# Patient Record
Sex: Female | Born: 1955
Health system: Southern US, Community
[De-identification: ages and names within clinical notes are randomized; demographics above are authoritative.]

## PROBLEM LIST (undated history)

## (undated) DIAGNOSIS — K219 Gastro-esophageal reflux disease without esophagitis: Secondary | ICD-10-CM

## (undated) DIAGNOSIS — I10 Essential (primary) hypertension: Secondary | ICD-10-CM

## (undated) DIAGNOSIS — I255 Ischemic cardiomyopathy: Secondary | ICD-10-CM

## (undated) DIAGNOSIS — I2510 Atherosclerotic heart disease of native coronary artery without angina pectoris: Secondary | ICD-10-CM

## (undated) DIAGNOSIS — E785 Hyperlipidemia, unspecified: Secondary | ICD-10-CM

## (undated) DIAGNOSIS — Z8679 Personal history of other diseases of the circulatory system: Secondary | ICD-10-CM

## (undated) DIAGNOSIS — I5022 Chronic systolic (congestive) heart failure: Secondary | ICD-10-CM

## (undated) HISTORY — PX: APPENDECTOMY: SHX54

## (undated) HISTORY — PX: OTHER SURGICAL HISTORY: SHX169

## (undated) HISTORY — DX: Hyperlipidemia, unspecified: E78.5

## (undated) HISTORY — DX: Personal history of other diseases of the circulatory system: Z86.79

## (undated) HISTORY — DX: Essential (primary) hypertension: I10

---
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
## 1999-06-06 ENCOUNTER — Ambulatory Visit (HOSPITAL_COMMUNITY): Admission: RE | Admit: 1999-06-06 | Discharge: 1999-06-06 | Payer: Self-pay | Admitting: Cardiology

## 2001-05-05 ENCOUNTER — Encounter: Payer: Self-pay | Admitting: Family Medicine

## 2001-05-05 ENCOUNTER — Ambulatory Visit (HOSPITAL_COMMUNITY): Admission: RE | Admit: 2001-05-05 | Discharge: 2001-05-05 | Payer: Self-pay | Admitting: *Deleted

## 2002-06-22 ENCOUNTER — Encounter (HOSPITAL_COMMUNITY): Admission: RE | Admit: 2002-06-22 | Discharge: 2002-07-22 | Payer: Self-pay | Admitting: Cardiology

## 2002-06-22 ENCOUNTER — Encounter: Payer: Self-pay | Admitting: Cardiology

## 2002-07-17 ENCOUNTER — Ambulatory Visit (HOSPITAL_COMMUNITY): Admission: RE | Admit: 2002-07-17 | Discharge: 2002-07-17 | Payer: Self-pay | Admitting: Pulmonary Disease

## 2002-08-01 ENCOUNTER — Ambulatory Visit (HOSPITAL_COMMUNITY): Admission: RE | Admit: 2002-08-01 | Discharge: 2002-08-01 | Payer: Self-pay | Admitting: Pulmonary Disease

## 2002-08-08 ENCOUNTER — Encounter (HOSPITAL_COMMUNITY): Admission: RE | Admit: 2002-08-08 | Discharge: 2002-09-07 | Payer: Self-pay | Admitting: Pulmonary Disease

## 2002-09-08 ENCOUNTER — Encounter (HOSPITAL_COMMUNITY): Admission: RE | Admit: 2002-09-08 | Discharge: 2002-10-08 | Payer: Self-pay | Admitting: Pulmonary Disease

## 2004-07-03 ENCOUNTER — Ambulatory Visit (HOSPITAL_COMMUNITY): Admission: RE | Admit: 2004-07-03 | Discharge: 2004-07-03 | Payer: Self-pay | Admitting: Family Medicine

## 2004-07-08 ENCOUNTER — Ambulatory Visit (HOSPITAL_COMMUNITY): Admission: RE | Admit: 2004-07-08 | Discharge: 2004-07-08 | Payer: Self-pay | Admitting: Family Medicine

## 2004-07-17 ENCOUNTER — Ambulatory Visit: Payer: Self-pay | Admitting: *Deleted

## 2004-07-24 ENCOUNTER — Encounter: Payer: Self-pay | Admitting: Emergency Medicine

## 2004-07-24 ENCOUNTER — Ambulatory Visit: Payer: Self-pay | Admitting: Cardiology

## 2004-07-24 ENCOUNTER — Observation Stay (HOSPITAL_COMMUNITY): Admission: AD | Admit: 2004-07-24 | Discharge: 2004-07-26 | Payer: Self-pay | Admitting: Cardiology

## 2004-07-29 ENCOUNTER — Ambulatory Visit: Payer: Self-pay | Admitting: *Deleted

## 2004-08-04 ENCOUNTER — Ambulatory Visit (HOSPITAL_COMMUNITY): Admission: RE | Admit: 2004-08-04 | Discharge: 2004-08-04 | Payer: Self-pay | Admitting: Family Medicine

## 2005-06-22 ENCOUNTER — Emergency Department (HOSPITAL_COMMUNITY): Admission: EM | Admit: 2005-06-22 | Discharge: 2005-06-22 | Payer: Self-pay | Admitting: Emergency Medicine

## 2005-10-26 ENCOUNTER — Ambulatory Visit (HOSPITAL_COMMUNITY): Admission: RE | Admit: 2005-10-26 | Discharge: 2005-10-26 | Payer: Self-pay | Admitting: Family Medicine

## 2007-03-28 ENCOUNTER — Ambulatory Visit (HOSPITAL_COMMUNITY): Admission: RE | Admit: 2007-03-28 | Discharge: 2007-03-28 | Payer: Self-pay | Admitting: Family Medicine

## 2008-06-14 ENCOUNTER — Ambulatory Visit (HOSPITAL_COMMUNITY): Admission: RE | Admit: 2008-06-14 | Discharge: 2008-06-14 | Payer: Self-pay | Admitting: Family Medicine

## 2010-09-05 NOTE — Cardiovascular Report (Signed)
NAMEMETZLI, POLLICK             ACCOUNT NO.:  [id number]   MEDICAL RECORD NO.:  [id number]          PATIENT TYPE:  INP   LOCATION:  6531                         FACILITY:  [facility]   PHYSICIAN:  Arturo Morton. Riley Kill, M.D. [facility] OF BIRTH:  1955-11-14   DATE OF PROCEDURE:  07/25/2004  DATE OF DISCHARGE:                              CARDIAC CATHETERIZATION   INDICATIONS:  Ms. Waskey is a 55 year old female.  She has had previous  catheterization with about 10-20% proximal LAD stenosis.  She has had some  recurrent chest pain and she has been difficult to evaluate on a noninvasive  basis.  She was transferred for further evaluation.   Of note, with her last episode of chest pain we did an aortic root shot to  exclude potential causes in the aortic root.   PROCEDURES:  1.  Left heart catheterization.  2.  Selective coronary arteriography.  3.  Selective left ventriculography.   DESCRIPTION OF PROCEDURE:  The patient was brought to the catheterization  lab and prepped and draped in the usual fashion.  Through an anterior  puncture, the  right femoral artery was easily entered.  We initially placed  a 4-French sheath.  There were separate take offs to the LAD and circumflex,  and we were unable to engage the LAD with a 4-0 or 3.5 diagnostic 4-French  catheter.  We eventually had to upgrade to a 6-French to use a JL-3 guiding  catheter.  The LAD was successfully engaged with this.  Ventriculography was  performed in the RAO projection.  She tolerated the procedure without  complication and was taken to the holding area in satisfactory clinical  condition.   HEMODYNAMIC DATA:  1.  Central aortic pressure:  160/95.  2.  Following this pressure, the patient was given intravenous metoprolol 5      mg.  3.  Left ventricular pressure:  134/11.  4.  No gradient on pullback across the aortic valve.   ANGIOGRAPHIC DATA:  1.  The left main is short and free of critical disease.  2.  The left  anterior descending artery courses to the apex.  Near the first      septal perforator and small first diagonal is an eccentric plaque with      calcification.  There is less than 20% narrowing at this site.  As on      the previous study, there is clear-cut eccentric calcification of the      vessel.  The distal vessel consists of a very large caliber 3.5 mm LAD      that courses to the apical tip, wraps the apex and provides the distal      inferior segment.  3.  The circumflex is a relatively large vessel.  The circumflex provides a      first marginal branch that has very minimal luminal irregularity in the      mid portion.  The vessel then courses around to the posterior lateral      segment where it provides a tiny posterior lateral branch and two      somewhat  larger posterior lateral branches.  In none of these is there      evidence of high grade focal narrowing.  4.  The right coronary artery is a fairly non or co-dominant vessel      providing a single small caliber posterior descending branch.  There is      perhaps minimal irregularity in the proximal PDA, but this is such a      tiny branch that it is insignificant.   Ventriculography in the RAO projection reveals preserved global systolic  function. No segmental abnormalities or contracture are identified.  Aortic  leaflets move well.  No significant mitral regurgitation is seen.   CONCLUSIONS:  1.  Well preserved left ventricular function.  2.  Calcification and mild narrowing of the proximal left anterior      descending artery as on the previous study without significant      progression in a large caliber vessel.   DISPOSITION:  1.  Discontinuation of heparin.  2.  Check D-dimer.      TDS/MEDQ  D:  07/25/2004  T:  07/25/2004  Job:  [id number]   cc:   Patrica Duel, M.D.  [address]  [place]  [place] [address]  Fax: [phone]   Vida Roller, M.D.  Fax: [phone]

## 2010-09-05 NOTE — Cardiovascular Report (Signed)
. [facility]  Patient:    Gloria Thompson, Gloria Thompson                    MRN: [id number] Proc. Date: 06/06/99 Adm. Date:  [id number] Disc. Date: [id number] Attending:  Ronaldo Miyamoto CC:         Gilford Silvius, M.D.             Thomas C. Wall, M.D. [facility]             CV Laboratory                        Cardiac Catheterization  INDICATIONS:  Ms. Jaskulski is a very pleasant 55 year old widowed mother of three, who presents with modest chest discomfort.  The exact etiology of the discomfort is  uncertain.  She is not a smoker but does have a strong family history of coronary heart disease.  As a result she was seen in consultation by Dr. Daleen Squibb and referred for further evaluation.  PROCEDURES: 1. Left heart catheterization. 2. Selective coronary angiography. 3. Selective left ventriculography. 4. Aortic root aortography.  DESCRIPTION OF PROCEDURE:  The procedure was performed from the right femoral artery using 6 French catheters.  She tolerated the procedure without complication.  HEMODYNAMICS:  The central aortic pressure was 171/105. LV pressure 172/17.  No  gradient on pullback across the aortic valve.  ADDENDUM:  The patient was given intravenous labetalol as well as Lopressor after the procedure to try to bring her blood pressure down.  ANGIOGRAPHIC DATA:  The proximal aortic root demonstrated no evidence of dissection and there was no significant aortic regurgitation.  The aortic leaflets moved well and there was no evidence of significant regurgitation across the valve.  The ventriculogram done in the RAO projection reveals preserved global systolic  function.  Ejection fraction would be estimated in the range of 70%.  The right coronary artery is a small caliber vessel that provides a single posterior single vessel.  The right coronary is tortuous distally.  It is free f significant disease.  The left main coronary artery is extremely  short.  As a result there was selective views of both the left anterior descending and circumflex coronary arteries.  The left anterior descending artery coursed through the apex and wrapped the apical tip.  The LAD itself provided a septal perforator and two diagonals.  Between the origin of the septal and the two diagonals was a mild area of 10% plaquing. There was some calcification noted in this area as well.  However, there does not appear to be significant flow limitation and the artery is a large caliber vessel at this point, being perhaps 3.5 mm or greater.  The mid and distal LAD wrap the apex.  Thre were three diagonal branches.  These were free of significant disease. The distal inferior wall is also supplied by the left anterior descending system.  The AV circumflex provides a marginal branch in the distal posterolateral system, which is nearly codominant with the right coronary system.  This system appears to be free of significant disease.  CONCLUSIONS: 1. Well-preserved left ventricular function. 2. Calcification and very mild eccentric plaquing of the mid left anterior    descending artery.  DISPOSITION:  The patient will be treated medically.  Followup will be with Dr. Daleen Squibb. DD:  06/06/99 TD:  06/07/99 Job: [id number]

## 2010-09-05 NOTE — Discharge Summary (Signed)
NAMESHIR, BERGMAN             ACCOUNT NO.:  [id number]   MEDICAL RECORD NO.:  [id number]          PATIENT TYPE:  INP   LOCATION:  6531                         FACILITY:  [facility]   PHYSICIAN:  Arturo Morton. Riley Kill, M.D. [facility] OF BIRTH:  03/01/1956   DATE OF ADMISSION:  07/24/2004  DATE OF DISCHARGE:  07/26/2004                                 DISCHARGE SUMMARY   PROCEDURES:  1.  Cardiac catheterization.  2.  Coronary arteriogram.  3.  Left ventriculogram.   DISCHARGE DIAGNOSES:  1.  Chest pain, no significant coronary artery disease by catheterization,      calcified left anterior descending less than 20% stenosis and normal      ejection fraction with a negative d-dimer as well.  2.  Hypertension.  3.  Mild hyperlipidemia with a total cholesterol of 185, triglycerides 74,      HDL 57, LDL 113, diet therapy at this time.  4.  Status post C-section.  5.  Status post appendectomy.   ALLERGIES:  Allergy or intolerance to CODEINE and MOST ANTIBIOTICS.   HOSPITAL COURSE:  Ms. Jaggers is a 55 year old female with a history of  nonobstructive coronary artery disease by cath in 2004.  She had exertional  chest pain which woke her at 7 a.m. on the day of admission.  She was  admitted for further evaluation and treatment.   Her cardiac enzymes were negative for MI and cardiac catheterization was  indicated to evaluate her for symptoms concerning for unstable anginal pain  and minor ST changes on her EKG.   The cardiac catheterization showed less than 20% stenosis of the proximal  LAD which was calcified vessel and no other disease.  Her EF was normal.  A  d-dimer was checked as well and was within normal limits at 0.37.   The next day Ms. Bebeau's groin was without ecchymosis, bruit, or hematoma.  Her vitals were stable.  She had no more chest pain and no shortness of  breath.  She did have some nausea but stated she had this at times.  Will  add a proton pump inhibitor to medication  regimen and decrease her aspirin  from 325 mg to 81 mg a day.  Pending evaluation by M.D., Ms. Solana is  tentatively considered stable for discharge on July 26, 2004 and will follow  up as an outpatient with Dr. Foster Simpson and Dr. Dorethea Clan.   DISCHARGE INSTRUCTIONS:   ACTIVITY:  Her activity level is to include no driving or strenuous activity  for two days.   MEDICATIONS:  She is to use Tylenol for pain.   DIET:  She is to stick to a low fat diet.   FOLLOW UP:  She is to call our office for problems with the cath site.  She  is to follow up with Dr. Dorethea Clan and Dr. Foster Simpson.   DISCHARGE MEDICATIONS:  1.  Norvasc 5 mg daily.  2.  Coated aspirin 81 mg daily.  3.  Protonix 40 mg daily.      RB/MEDQ  D:  07/26/2004  T:  07/26/2004  Job:  [id number]  cc:   Patrica Duel, M.D.  [address]  [place]  [place] [address]  Fax: [phone]   Vida Roller, M.D.  Fax: [phone]

## 2011-03-02 ENCOUNTER — Emergency Department (HOSPITAL_COMMUNITY)
Admission: EM | Admit: 2011-03-02 | Discharge: 2011-03-02 | Disposition: A | Discharge status: Home / Self Care | Payer: No Typology Code available for payment source | Attending: Emergency Medicine | Admitting: Emergency Medicine

## 2011-03-02 ENCOUNTER — Encounter: Payer: Self-pay | Admitting: Emergency Medicine

## 2011-03-02 DIAGNOSIS — Y92410 Unspecified street and highway as the place of occurrence of the external cause: Secondary | ICD-10-CM | POA: Insufficient documentation

## 2011-03-02 DIAGNOSIS — I10 Essential (primary) hypertension: Secondary | ICD-10-CM | POA: Insufficient documentation

## 2011-03-02 DIAGNOSIS — K219 Gastro-esophageal reflux disease without esophagitis: Secondary | ICD-10-CM | POA: Insufficient documentation

## 2011-03-02 DIAGNOSIS — R209 Unspecified disturbances of skin sensation: Secondary | ICD-10-CM | POA: Insufficient documentation

## 2011-03-02 DIAGNOSIS — IMO0001 Reserved for inherently not codable concepts without codable children: Secondary | ICD-10-CM | POA: Insufficient documentation

## 2011-03-02 DIAGNOSIS — M7918 Myalgia, other site: Secondary | ICD-10-CM

## 2011-03-02 DIAGNOSIS — M542 Cervicalgia: Secondary | ICD-10-CM | POA: Insufficient documentation

## 2011-03-02 DIAGNOSIS — IMO0002 Reserved for concepts with insufficient information to code with codable children: Secondary | ICD-10-CM | POA: Insufficient documentation

## 2011-03-02 HISTORY — DX: Essential (primary) hypertension: I10

## 2011-03-02 HISTORY — DX: Gastro-esophageal reflux disease without esophagitis: K21.9

## 2011-03-02 MED ORDER — CYCLOBENZAPRINE HCL 5 MG PO TABS: 5.0000 mg | ORAL_TABLET | Freq: Three times a day (TID) | ORAL | Status: AC | PRN

## 2011-03-02 MED ORDER — OXYCODONE-ACETAMINOPHEN 5-325 MG PO TABS: 1.0000 | ORAL_TABLET | ORAL | Status: AC | PRN

## 2011-03-02 NOTE — ED Provider Notes (Signed)
Medical screening examination/treatment/procedure(s) were performed by non-physician practitioner and as supervising physician I was immediately available for consultation/collaboration.   Benny Lennert, MD 03/02/11 [phone]
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
## 2011-03-02 NOTE — ED Notes (Signed)
Pt a/ox4. Resp even and unlabored. NAD at this time. D/C instructions and Rx x 2 reviewed with pt. Pt verbalized understanding. Pt ambulated to POV with steady gate.  

## 2011-03-17 ENCOUNTER — Other Ambulatory Visit (HOSPITAL_COMMUNITY): Payer: Self-pay | Admitting: Family Medicine

## 2011-03-19 ENCOUNTER — Other Ambulatory Visit (HOSPITAL_COMMUNITY): Payer: Self-pay | Admitting: Family Medicine

## 2011-03-19 ENCOUNTER — Ambulatory Visit (HOSPITAL_COMMUNITY)
Admission: RE | Admit: 2011-03-19 | Discharge: 2011-03-19 | Disposition: A | Discharge status: Home / Self Care | Payer: No Typology Code available for payment source | Source: Ambulatory Visit | Attending: Family Medicine | Admitting: Family Medicine

## 2011-03-19 DIAGNOSIS — M542 Cervicalgia: Secondary | ICD-10-CM | POA: Insufficient documentation

## 2011-03-19 DIAGNOSIS — M546 Pain in thoracic spine: Secondary | ICD-10-CM | POA: Insufficient documentation

## 2011-03-19 DIAGNOSIS — M545 Low back pain, unspecified: Secondary | ICD-10-CM | POA: Insufficient documentation

## 2011-03-19 DIAGNOSIS — X58XXXA Exposure to other specified factors, initial encounter: Secondary | ICD-10-CM | POA: Insufficient documentation

## 2011-03-19 DIAGNOSIS — S060X0A Concussion without loss of consciousness, initial encounter: Secondary | ICD-10-CM | POA: Insufficient documentation

## 2011-03-19 DIAGNOSIS — R51 Headache: Secondary | ICD-10-CM | POA: Insufficient documentation

## 2012-12-27 ENCOUNTER — Other Ambulatory Visit (HOSPITAL_COMMUNITY): Payer: Self-pay | Admitting: *Deleted

## 2012-12-27 DIAGNOSIS — Z139 Encounter for screening, unspecified: Secondary | ICD-10-CM

## 2013-01-02 ENCOUNTER — Ambulatory Visit (HOSPITAL_COMMUNITY)
Admission: RE | Admit: 2013-01-02 | Discharge: 2013-01-02 | Disposition: A | Discharge status: Home / Self Care | Payer: Self-pay | Source: Ambulatory Visit | Attending: *Deleted | Admitting: *Deleted

## 2013-01-02 DIAGNOSIS — Z139 Encounter for screening, unspecified: Secondary | ICD-10-CM

## 2013-09-20 ENCOUNTER — Encounter (HOSPITAL_COMMUNITY): Payer: Self-pay | Admitting: Emergency Medicine

## 2013-09-20 ENCOUNTER — Emergency Department (HOSPITAL_COMMUNITY)
Admission: EM | Admit: 2013-09-20 | Discharge: 2013-09-20 | Disposition: A | Discharge status: Home / Self Care | Payer: BC Managed Care – PPO | Attending: Emergency Medicine | Admitting: Emergency Medicine

## 2013-09-20 DIAGNOSIS — K219 Gastro-esophageal reflux disease without esophagitis: Secondary | ICD-10-CM | POA: Insufficient documentation

## 2013-09-20 DIAGNOSIS — R1110 Vomiting, unspecified: Secondary | ICD-10-CM | POA: Insufficient documentation

## 2013-09-20 DIAGNOSIS — I10 Essential (primary) hypertension: Secondary | ICD-10-CM | POA: Insufficient documentation

## 2013-09-20 DIAGNOSIS — R05 Cough: Secondary | ICD-10-CM | POA: Insufficient documentation

## 2013-09-20 DIAGNOSIS — R509 Fever, unspecified: Secondary | ICD-10-CM | POA: Insufficient documentation

## 2013-09-20 DIAGNOSIS — Z79899 Other long term (current) drug therapy: Secondary | ICD-10-CM | POA: Insufficient documentation

## 2013-09-20 DIAGNOSIS — H109 Unspecified conjunctivitis: Secondary | ICD-10-CM | POA: Insufficient documentation

## 2013-09-20 DIAGNOSIS — Z87891 Personal history of nicotine dependence: Secondary | ICD-10-CM | POA: Insufficient documentation

## 2013-09-20 DIAGNOSIS — R059 Cough, unspecified: Secondary | ICD-10-CM | POA: Insufficient documentation

## 2013-09-20 MED ORDER — PREDNISONE 10 MG PO TABS: 20.0000 mg | ORAL_TABLET | Freq: Two times a day (BID) | ORAL | Status: DC

## 2013-09-20 MED ORDER — PREDNISONE 50 MG PO TABS
60.0000 mg | ORAL_TABLET | Freq: Once | ORAL | Status: AC
  Administered 2013-09-20: 60 mg via ORAL
  Filled 2013-09-20 (×2): qty 1

## 2013-09-20 NOTE — Discharge Instructions (Signed)
° ° °  Continue to use warm compresses on your eyes 3 or 4 times a day to improve the discomfort and encourage healing. Stop taking the azithromycin tablets. Start the prednisone prescription this evening. See, your doctor, if not better in 2 or 3 days.    Conjunctivitis Conjunctivitis is commonly called "pink eye." Conjunctivitis can be caused by bacterial or viral infection, allergies, or injuries. There is usually redness of the lining of the eye, itching, discomfort, and sometimes discharge. There may be deposits of matter along the eyelids. A viral infection usually causes a watery discharge, while a bacterial infection causes a yellowish, thick discharge. Pink eye is very contagious and spreads by direct contact. You may be given antibiotic eyedrops as part of your treatment. Before using your eye medicine, remove all drainage from the eye by washing gently with warm water and cotton balls. Continue to use the medication until you have awakened 2 mornings in a row without discharge from the eye. Do not rub your eye. This increases the irritation and helps spread infection. Use separate towels from other household members. Wash your hands with soap and water before and after touching your eyes. Use cold compresses to reduce pain and sunglasses to relieve irritation from light. Do not wear contact lenses or wear eye makeup until the infection is gone. SEEK MEDICAL CARE IF:   Your symptoms are not better after 3 days of treatment.  You have increased pain or trouble seeing.  The outer eyelids become very red or swollen. Document Released: 05/14/2004 Document Revised: 06/29/2011 Document Reviewed: 04/06/2005 [facility] Patient Information 2014 [place], [place].

## 2013-09-20 NOTE — Care Management Note (Signed)
ED/CM noted patient did not have health insurance and/or PCP listed in the computer.  Patient was given the [place] resource handout with information on the clinics, food pantries, and the handout for new health insurance sign-up.  Patient expressed appreciation for information received. 

## 2013-09-20 NOTE — ED Notes (Addendum)
Pt states seen at urgent care 2 days ago, DX with bronchitis and pink eye (both eyes), pt placed on Z-pack and polytrim, pt states pain in eye is worse, vision blurry and thinks the medicine is not working.

## 2013-09-20 NOTE — ED Provider Notes (Signed)
CSN: [id number]     Arrival date & time 09/20/13  0827 History  This chart was scribed for Flint Melter, MD by Joaquin Music, ED Scribe. This patient was seen in room APA06/APA06 and the patient's care was started at 8:48 AM.   Chief Complaint  Patient presents with  . Eye Problem   The history is provided by the patient. No language interpreter was used.   HPI Comments: Gloria Thompson is a 58 y.o. female with hx of HTN who presents to the Emergency Department complaining of bilateral eye pain and redness that began 2 days ago. States she was seen at an urgent care and was diagnosed with bronchitis and bilateral conjunctivitis. She has been using Zithromax tablets 250-mg and Polymyxin Sulf eye drops; states she has burning to eye when using the drops although her sx are improving. Pt stats she has been using warm compresses and has noticed yellow eye discharge. She states the Zithromax has been causing her to have CP. States she is still coughing up green sputum and states her cough and eye are not improving; reports having an episode of emesis, cough induced, yesterday evening. Reports having a fever of 101 and has been taking OTC Tylenol with minimal relief. States she has had a decreased appetite. States she is not able to take abx due to sideaffects. Denies hx of smoking.  [facility].  Past Medical History  Diagnosis Date  . Hypertension   . Acid reflux    History reviewed. No pertinent past surgical history. History reviewed. No pertinent family history. History  Substance Use Topics  . Smoking status: Former Games developer  . Smokeless tobacco: Not on file  . Alcohol Use: No   OB History   Grav Para Term Preterm Abortions TAB SAB Ect Mult Living                 Review of Systems  Constitutional: Positive for fever (of 101).  Eyes: Positive for pain, discharge, redness, itching and visual disturbance.  Respiratory: Positive for cough.    Gastrointestinal: Positive for vomiting (cough induced).  All other systems reviewed and are negative.  Allergies  Other and Codeine  Home Medications   Prior to Admission medications   Medication Sig Start Date End Date Taking? Authorizing Provider  amLODipine (NORVASC) 5 MG tablet Take 5 mg by mouth daily.      Historical Provider, MD  Aromatic Inhalants (VICKS VAPOR INHALER IN) Inhale 1-2 sprays into the lungs as needed. To aid in relief of heachache     Historical Provider, MD  esomeprazole (NEXIUM) 20 MG capsule Take 20 mg by mouth daily before breakfast.      Historical Provider, MD  predniSONE (DELTASONE) 10 MG tablet Take 2 tablets (20 mg total) by mouth 2 (two) times daily. 09/20/13   Flint Melter, MD  tetrahydrozoline (VISINE) 0.05 % ophthalmic solution Place 1 drop into both eyes daily.      Historical Provider, MD   Triage Vitals:BP 162/90  Pulse 85  Temp(Src) 98.3 F (36.8 C) (Oral)  Resp 18  Ht 5\' 5"  (1.651 m)  Wt 141 lb (63.957 kg)  BMI 23.46 kg/m2  SpO2 99%  Physical Exam  Nursing note and vitals reviewed. Constitutional: She is oriented to person, place, and time. She appears well-developed and well-nourished.  HENT:  Head: Normocephalic and atraumatic.  Eyes: EOM are normal. Pupils are equal, round, and reactive to light. Right conjunctiva is  injected. Left conjunctiva is injected.  Bilateral injection of the conjunctiva with clear dischare  Neck: Normal range of motion and phonation normal. Neck supple.  Cardiovascular: Normal rate, regular rhythm and intact distal pulses.   Pulmonary/Chest: Effort normal and breath sounds normal. She has no wheezes. She has no rales. She exhibits no tenderness.  Abdominal: Soft. She exhibits no distension. There is no tenderness. There is no guarding.  Musculoskeletal: Normal range of motion.  Neurological: She is alert and oriented to person, place, and time. She exhibits normal muscle tone.  Skin: Skin is warm and dry.   Psychiatric: She has a normal mood and affect. Her behavior is normal. Judgment and thought content normal.   ED Course  Procedures DIAGNOSTIC STUDIES: Oxygen Saturation is 99% on RA, normal by my interpretation.    COORDINATION OF CARE: 8:55 AM-Discussed treatment plan which includes discharge pt with prednisone. Encourage pt to use warm compresses, stop taking Zithromax, and [facility]. Pt agreed to plan.   Labs Review Labs Reviewed - No data to display  Imaging Review No results found.   EKG Interpretation None      MDM   Final diagnoses:  Conjunctivitis    Nonspecific conjunctivitis, that is improving by the patient's report. She describes chest discomfort after starting Zithromax is likely related to GERD. Doubt systemic bacterial infection, hepatitis, or metabolic instability.  Nursing Notes Reviewed/ Care Coordinated Applicable Imaging Reviewed Interpretation of Laboratory Data incorporated into ED treatment  The patient appears reasonably screened and/or stabilized for discharge and I doubt any other medical condition or other [facility] requiring further screening, evaluation, or treatment in the ED at this time prior to discharge.  Plan: Home Medications- stop Zithromax; Home Treatments- warm compresses; return here if the recommended treatment, does not improve the symptoms; Recommended follow up- PCP prn  I personally performed the services described in this documentation, which was scribed in my presence. The recorded information has been reviewed and is accurate.     Flint MelterElliott L Latacha Texeira, MD 09/20/13 1626

## 2014-02-16 ENCOUNTER — Other Ambulatory Visit (HOSPITAL_COMMUNITY): Payer: Self-pay | Admitting: Physician Assistant

## 2014-02-16 DIAGNOSIS — Z1231 Encounter for screening mammogram for malignant neoplasm of breast: Secondary | ICD-10-CM

## 2014-02-22 ENCOUNTER — Ambulatory Visit (HOSPITAL_COMMUNITY): Payer: BC Managed Care – PPO

## 2015-04-26 ENCOUNTER — Ambulatory Visit (HOSPITAL_COMMUNITY)
Admission: RE | Admit: 2015-04-26 | Discharge: 2015-04-26 | Disposition: A | Discharge status: Home / Self Care | Payer: BLUE CROSS/BLUE SHIELD | Source: Ambulatory Visit | Attending: Family Medicine | Admitting: Family Medicine

## 2015-04-26 ENCOUNTER — Other Ambulatory Visit (HOSPITAL_COMMUNITY): Payer: Self-pay | Admitting: Family Medicine

## 2015-04-26 DIAGNOSIS — M542 Cervicalgia: Secondary | ICD-10-CM

## 2015-04-26 DIAGNOSIS — R51 Headache: Secondary | ICD-10-CM | POA: Insufficient documentation

## 2015-04-26 DIAGNOSIS — R200 Anesthesia of skin: Secondary | ICD-10-CM | POA: Insufficient documentation

## 2015-04-26 DIAGNOSIS — M50322 Other cervical disc degeneration at C5-C6 level: Secondary | ICD-10-CM | POA: Insufficient documentation

## 2015-05-06 ENCOUNTER — Encounter: Payer: Self-pay | Admitting: *Deleted

## 2015-05-06 ENCOUNTER — Other Ambulatory Visit: Payer: Self-pay

## 2015-05-06 DIAGNOSIS — Z1231 Encounter for screening mammogram for malignant neoplasm of breast: Secondary | ICD-10-CM

## 2015-05-13 ENCOUNTER — Encounter: Payer: Self-pay | Admitting: Adult Health

## 2015-05-13 ENCOUNTER — Ambulatory Visit (INDEPENDENT_AMBULATORY_CARE_PROVIDER_SITE_OTHER): Payer: BLUE CROSS/BLUE SHIELD | Admitting: Adult Health

## 2015-05-13 ENCOUNTER — Other Ambulatory Visit (HOSPITAL_COMMUNITY)
Admission: RE | Admit: 2015-05-13 | Discharge: 2015-05-13 | Disposition: A | Discharge status: Home / Self Care | Payer: BLUE CROSS/BLUE SHIELD | Source: Ambulatory Visit | Attending: Adult Health | Admitting: Adult Health

## 2015-05-13 VITALS — BP 158/100 | HR 90 | Ht 64.0 in | Wt 159.0 lb

## 2015-05-13 DIAGNOSIS — Z01419 Encounter for gynecological examination (general) (routine) without abnormal findings: Secondary | ICD-10-CM | POA: Diagnosis not present

## 2015-05-13 DIAGNOSIS — Z1212 Encounter for screening for malignant neoplasm of rectum: Secondary | ICD-10-CM

## 2015-05-13 DIAGNOSIS — Z1151 Encounter for screening for human papillomavirus (HPV): Secondary | ICD-10-CM | POA: Insufficient documentation

## 2015-05-13 DIAGNOSIS — I10 Essential (primary) hypertension: Secondary | ICD-10-CM | POA: Insufficient documentation

## 2015-05-13 LAB — HEMOCCULT GUIAC POC 1CARD (OFFICE): FECAL OCCULT BLD: NEGATIVE

## 2015-05-13 MED ORDER — LOSARTAN POTASSIUM 100 MG PO TABS: 100.0000 mg | ORAL_TABLET | Freq: Every day | ORAL | Status: DC

## 2015-05-13 NOTE — Patient Instructions (Addendum)
Physical in 1 year, pap in 3 years if normal Mammogram yearly Labs with PCP Colonoscopy advised  Go get BP meds and take 1 daily and get appointment with Dr Phillips Odor

## 2015-05-13 NOTE — Progress Notes (Signed)
Patient ID: Gloria Thompson, female   DOB: [birth year], 60 y.o.   MRN: [id number] History of Present Illness:  Gloria Thompson is a 60 year old white female, widowed, in for well woman gyn exam and pap.Her husband died at 70 and she raised 3 children.She sees Dr Phillips Odor and was placed on Losartan-HCT and she stopped it due to problems voiding and stomach hurt and problems resolved.Has headache today.She has occasional vaginal itch. She declines the flu shot. PCP is Dr Phillips Odor.  Current Medications, Allergies, Past Medical History, Past Surgical History, Family History and Social History were reviewed in [employer] record.     Review of Systems: Patient denies any daily headaches, hearing loss, fatigue, blurred vision, shortness of breath, chest pain, abdominal pain, problems with bowel movements, urination, or intercourse(not currently active). No joint pain or mood swings.    Physical Exam:BP 158/100 mmHg  Pulse 90  Ht  (1.626 m)  Wt 159 lb (72.122 kg)  BMI 27.28 kg/m2  General:  Well developed, well nourished, no acute distress Skin:  Warm and dry Neck:  Midline trachea, normal thyroid, good ROM, no lymphadenopathy, no carotid bruits heard. Lungs; Clear to auscultation bilaterally Breast:  No dominant palpable mass, retraction, or nipple discharge Cardiovascular: Regular rate and rhythm Abdomen:  Soft, non tender, no hepatosplenomegaly Pelvic:  External genitalia is normal in appearance, no lesions.  The vagina has decreased color, moisture and rugae. Urethra has no lesions or masses. The cervix is smooth, pap with HPV performed.  Uterus is felt to be normal size, shape, and contour.  No adnexal masses or tenderness noted.Bladder is non tender, no masses felt. Rectal: Good sphincter tone, no polyps, or hemorrhoids felt.  Hemoccult negative. Extremities/musculoskeletal:  No swelling or varicosities noted, no clubbing or cyanosis Psych:  No mood changes, alert and  cooperative,seems happy Discussed importance of taking BP meds and following up with Dr Phillips Odor and she agrees.Will Rx losartan without HCTZ till she sees Dr Phillips Odor.  Impression: Well woman gyn exam and pap Hypertension     Plan: Rx losartan 100 mg #30 take 1 daily with 1 refill Physical in 1 year, pap in 3 if normal Mammogram yearly Colonoscopy advised Try [facility] with PCP Call Dr Phillips Odor and make F/U appt.

## 2015-05-16 ENCOUNTER — Encounter (INDEPENDENT_AMBULATORY_CARE_PROVIDER_SITE_OTHER): Payer: Self-pay | Admitting: *Deleted

## 2015-05-16 LAB — CYTOLOGY - PAP

## 2016-04-29 ENCOUNTER — Other Ambulatory Visit (HOSPITAL_COMMUNITY): Payer: Self-pay | Admitting: Registered Nurse

## 2016-04-29 ENCOUNTER — Other Ambulatory Visit: Payer: Self-pay | Admitting: Physician Assistant

## 2016-05-29 ENCOUNTER — Other Ambulatory Visit: Payer: Self-pay | Admitting: Physician Assistant

## 2019-08-09 ENCOUNTER — Other Ambulatory Visit: Payer: Self-pay

## 2019-08-09 ENCOUNTER — Emergency Department (HOSPITAL_COMMUNITY): Payer: Self-pay

## 2019-08-09 ENCOUNTER — Encounter (HOSPITAL_COMMUNITY): Payer: Self-pay | Admitting: Emergency Medicine

## 2019-08-09 ENCOUNTER — Inpatient Hospital Stay (HOSPITAL_COMMUNITY)
Admission: EM | Admit: 2019-08-09 | Discharge: 2019-08-12 | DRG: 246 | Disposition: A | Discharge status: Home / Self Care | Payer: Self-pay | Attending: Cardiovascular Disease | Admitting: Cardiovascular Disease

## 2019-08-09 DIAGNOSIS — R519 Headache, unspecified: Secondary | ICD-10-CM | POA: Diagnosis not present

## 2019-08-09 DIAGNOSIS — I2510 Atherosclerotic heart disease of native coronary artery without angina pectoris: Secondary | ICD-10-CM

## 2019-08-09 DIAGNOSIS — I255 Ischemic cardiomyopathy: Secondary | ICD-10-CM

## 2019-08-09 DIAGNOSIS — E785 Hyperlipidemia, unspecified: Secondary | ICD-10-CM | POA: Diagnosis present

## 2019-08-09 DIAGNOSIS — I213 ST elevation (STEMI) myocardial infarction of unspecified site: Secondary | ICD-10-CM

## 2019-08-09 DIAGNOSIS — I10 Essential (primary) hypertension: Secondary | ICD-10-CM | POA: Diagnosis present

## 2019-08-09 DIAGNOSIS — Z87442 Personal history of urinary calculi: Secondary | ICD-10-CM

## 2019-08-09 DIAGNOSIS — E669 Obesity, unspecified: Secondary | ICD-10-CM | POA: Diagnosis present

## 2019-08-09 DIAGNOSIS — K219 Gastro-esophageal reflux disease without esophagitis: Secondary | ICD-10-CM | POA: Diagnosis present

## 2019-08-09 DIAGNOSIS — Z885 Allergy status to narcotic agent status: Secondary | ICD-10-CM

## 2019-08-09 DIAGNOSIS — Z825 Family history of asthma and other chronic lower respiratory diseases: Secondary | ICD-10-CM

## 2019-08-09 DIAGNOSIS — Z833 Family history of diabetes mellitus: Secondary | ICD-10-CM

## 2019-08-09 DIAGNOSIS — E782 Mixed hyperlipidemia: Secondary | ICD-10-CM

## 2019-08-09 DIAGNOSIS — Q250 Patent ductus arteriosus: Secondary | ICD-10-CM

## 2019-08-09 DIAGNOSIS — Z20822 Contact with and (suspected) exposure to covid-19: Secondary | ICD-10-CM | POA: Diagnosis present

## 2019-08-09 DIAGNOSIS — I2102 ST elevation (STEMI) myocardial infarction involving left anterior descending coronary artery: Principal | ICD-10-CM | POA: Diagnosis present

## 2019-08-09 DIAGNOSIS — Z8249 Family history of ischemic heart disease and other diseases of the circulatory system: Secondary | ICD-10-CM

## 2019-08-09 DIAGNOSIS — Z955 Presence of coronary angioplasty implant and graft: Secondary | ICD-10-CM

## 2019-08-09 DIAGNOSIS — Z83438 Family history of other disorder of lipoprotein metabolism and other lipidemia: Secondary | ICD-10-CM

## 2019-08-09 DIAGNOSIS — Z6831 Body mass index (BMI) 31.0-31.9, adult: Secondary | ICD-10-CM

## 2019-08-09 DIAGNOSIS — I110 Hypertensive heart disease with heart failure: Secondary | ICD-10-CM | POA: Diagnosis present

## 2019-08-09 DIAGNOSIS — I5021 Acute systolic (congestive) heart failure: Secondary | ICD-10-CM

## 2019-08-09 HISTORY — DX: Atherosclerotic heart disease of native coronary artery without angina pectoris: I25.10

## 2019-08-09 MED ORDER — ASPIRIN 81 MG PO CHEW
324.0000 mg | CHEWABLE_TABLET | Freq: Once | ORAL | Status: AC
  Administered 2019-08-09: 324 mg via ORAL
  Filled 2019-08-09: qty 4

## 2019-08-09 MED ORDER — HEPARIN SODIUM (PORCINE) 5000 UNIT/ML IJ SOLN
4000.0000 [IU] | Freq: Once | INTRAMUSCULAR | Status: AC
  Administered 2019-08-09: 4000 [IU] via INTRAVENOUS
  Filled 2019-08-09: qty 1

## 2019-08-09 MED ORDER — SODIUM CHLORIDE 0.9 % IV SOLN: INTRAVENOUS | Status: DC

## 2019-08-09 MED ORDER — NITROGLYCERIN IN D5W 200-5 MCG/ML-% IV SOLN
5.0000 ug/min | INTRAVENOUS | Status: DC
  Administered 2019-08-10: 5 ug/min via INTRAVENOUS
  Filled 2019-08-09: qty 250

## 2019-08-09 NOTE — ED Triage Notes (Addendum)
Pt C/O chest pain that started this morning. Pt states she has been SOB with a cough and diaphoretic throughout the day. Pt states just prior to arrival she had one episode of emesis. Also reporting "there feels like there is something stuck in my throat."

## 2019-08-09 NOTE — ED Provider Notes (Signed)
[facility] EMERGENCY DEPARTMENT Provider Note   CSN: [id number] Arrival date & time: 08/09/19  2303   Time seen 11:45 PM  History Chief Complaint  Patient presents with  . Chest Pain    Gloria Thompson is a 64 y.o. female.  HPI   Patient states the last couple months she has been having some chest pain that will go away.  She states that 430 this morning she started having the chest pain that would come and go today if she would do something.  If she rested it would go away.  She states about 2 hours prior to coming to the ED the pain restarted again.  She states the pain is in the center of her chest and radiates into her left shoulder and down into her left arm.  She states the pain is burning and aching.  She has had nausea and vomiting and has been diaphoretic.  She started feeling short of breath this evening which prompted her to come to the ED.  She states she feels like her hands and her feet have been swelling recently.  She has not had this chest pain evaluated before.  She states she has a history of hypertension but she denies high cholesterol.  Looking at her medication she has been on Zocor before but she states she is not on it now.  She states her family history is positive for coronary artery disease in her mother, father, and her 2 brothers and states "just about everybody in the family".  Patient does not smoke.  Patient vomited on the way to the ED and feels like she still has something stuck in her throat.  PCP Sharilyn Sites, MD   Past Medical History:  Diagnosis Date  . Acid reflux   . Hyperlipidemia   . Hypertension     Patient Active Problem List   Diagnosis Date Noted  . Hypertension 05/13/2015    Past Surgical History:  Procedure Laterality Date  . APPENDECTOMY    . CESAREAN SECTION  [id number]  . kidney stones       OB History    Gravida  3   Para  3   Term      Preterm      AB      Living  3     SAB      TAB      Ectopic      Multiple      Live Births              Family History  Problem Relation Age of Onset  . Heart disease Mother   . Heart attack Father   . Diabetes Sister   . Heart disease Brother   . Hypertension Brother   . Hypertension Sister   . Hyperlipidemia Sister   . COPD Sister   . Hypertension Sister   . Hyperlipidemia Sister   . Heart disease Brother   . Hypertension Brother   . Hypertension Brother   . Heart disease Brother   . Heart disease Brother   . Hypertension Brother     Social History   Tobacco Use  . Smoking status: Never Smoker  . Smokeless tobacco: Never Used  Substance Use Topics  . Alcohol use: No  . Drug use: No  lives at home   Home Medications Prior to Admission medications   Medication Sig Start Date End Date Taking? Authorizing Provider  acetaminophen (TYLENOL ARTHRITIS PAIN) 650 MG CR  tablet Take 650 mg by mouth daily.    [provider]  acetaminophen (TYLENOL) 325 MG tablet Take 650 mg by mouth as needed.    [provider]  aspirin 325 MG tablet Take 325 mg by mouth daily.    [provider]  esomeprazole (NEXIUM) 20 MG capsule Take 20 mg by mouth daily before breakfast.      [provider]  losartan (COZAAR) 100 MG tablet Take 1 tablet (100 mg total) by mouth daily. 05/13/15   Adline Potter, NP  meloxicam (MOBIC) 7.5 MG tablet Take 7.5 mg by mouth 2 (two) times daily as needed for pain.    [provider]  simvastatin (ZOCOR) 20 MG tablet Take 20 mg by mouth daily.    [provider]  Patient states she only takes losartan and Nexium, denies being on Zocor  Allergies    Other and Codeine  Review of Systems   Review of Systems  All other systems reviewed and are negative.   Physical Exam Updated Vital Signs BP (!) 148/94   Pulse 73   Temp (!) 97.5 F (36.4 C) (Oral)   Resp 20   Ht 5\' 4"  (1.626 m)   Wt 83.9 kg   SpO2 90%   BMI 31.76 kg/m   Physical Exam Vitals and  nursing note reviewed.  Constitutional:      General: She is not in acute distress.    Appearance: Normal appearance. She is well-developed. She is obese. She is not ill-appearing or toxic-appearing.  HENT:     Head: Normocephalic and atraumatic.     Right Ear: External ear normal.     Left Ear: External ear normal.     Nose: Nose normal. No mucosal edema or rhinorrhea.  Eyes:     Extraocular Movements: Extraocular movements intact.     Conjunctiva/sclera: Conjunctivae normal.     Pupils: Pupils are equal, round, and reactive to light.  Cardiovascular:     Rate and Rhythm: Normal rate and regular rhythm.     Heart sounds: Normal heart sounds. No murmur. No friction rub. No gallop.   Pulmonary:     Effort: Pulmonary effort is normal. No respiratory distress.     Breath sounds: Normal breath sounds. No wheezing, rhonchi or rales.  Chest:     Chest wall: No tenderness or crepitus.  Abdominal:     General: Bowel sounds are normal. There is no distension.     Palpations: Abdomen is soft.     Tenderness: There is no abdominal tenderness. There is no guarding or rebound.  Musculoskeletal:        General: No tenderness. Normal range of motion.     Cervical back: Full passive range of motion without pain, normal range of motion and neck supple.     Right lower leg: No edema.     Left lower leg: No edema.     Comments: Moves all extremities well.   Skin:    General: Skin is warm and dry.     Coloration: Skin is not pale.     Findings: No erythema or rash.  Neurological:     General: No focal deficit present.     Mental Status: She is alert and oriented to person, place, and time.     Cranial Nerves: No cranial nerve deficit.  Psychiatric:        Mood and Affect: Mood normal. Mood is not anxious.  Speech: Speech normal.        Behavior: Behavior normal.        Thought Content: Thought content normal.     ED Results / Procedures / Treatments   Labs (all labs ordered are  listed, but only abnormal results are displayed) Results for orders placed or performed during the hospital encounter of 08/09/19  Respiratory Panel by RT PCR (Flu A&B, Covid) - Nasopharyngeal Swab   Specimen: Nasopharyngeal Swab  Result Value Ref Range   SARS Coronavirus 2 by RT PCR NEGATIVE NEGATIVE   Influenza A by PCR NEGATIVE NEGATIVE   Influenza B by PCR NEGATIVE NEGATIVE  CBC with Differential/Platelet  Result Value Ref Range   WBC 11.5 (H) 4.0 - 10.5 K/uL   RBC 4.83 3.87 - 5.11 MIL/uL   Hemoglobin 15.1 (H) 12.0 - 15.0 g/dL   HCT 28.3 15.1 - 76.1 %   MCV 92.8 80.0 - 100.0 fL   MCH 31.3 26.0 - 34.0 pg   MCHC 33.7 30.0 - 36.0 g/dL   RDW 60.7 37.1 - 06.2 %   Platelets 352 150 - 400 K/uL   nRBC 0.0 0.0 - 0.2 %   Neutrophils Relative % 58 %   Neutro Abs 6.8 1.7 - 7.7 K/uL   Lymphocytes Relative 30 %   Lymphs Abs 3.4 0.7 - 4.0 K/uL   Monocytes Relative 7 %   Monocytes Absolute 0.8 0.1 - 1.0 K/uL   Eosinophils Relative 4 %   Eosinophils Absolute 0.4 0.0 - 0.5 K/uL   Basophils Relative 1 %   Basophils Absolute 0.1 0.0 - 0.1 K/uL   Immature Granulocytes 0 %   Abs Immature Granulocytes 0.03 0.00 - 0.07 K/uL  Protime-INR  Result Value Ref Range   Prothrombin Time 14.4 11.4 - 15.2 seconds   INR 1.1 0.8 - 1.2  APTT  Result Value Ref Range   aPTT 27 24 - 36 seconds  Comprehensive metabolic panel  Result Value Ref Range   Sodium 135 135 - 145 mmol/L   Potassium 3.3 (L) 3.5 - 5.1 mmol/L   Chloride 101 98 - 111 mmol/L   CO2 21 (L) 22 - 32 mmol/L   Glucose, Bld 161 (H) 70 - 99 mg/dL   BUN 17 8 - 23 mg/dL   Creatinine, Ser 6.94 (H) 0.44 - 1.00 mg/dL   Calcium 9.0 8.9 - 85.4 mg/dL   Total Protein 8.5 (H) 6.5 - 8.1 g/dL   Albumin 4.4 3.5 - 5.0 g/dL   AST 43 (H) 15 - 41 U/L   ALT 37 0 - 44 U/L   Alkaline Phosphatase 127 (H) 38 - 126 U/L   Total Bilirubin 0.4 0.3 - 1.2 mg/dL   GFR calc non Af Amer 57 (L) >60 mL/min   GFR calc Af Amer >60 >60 mL/min   Anion gap 13 5 - 15    Lipid panel  Result Value Ref Range   Cholesterol 244 (H) 0 - 200 mg/dL   Triglycerides 627 (H) <150 mg/dL   HDL 52 >03 mg/dL   Total CHOL/HDL Ratio 4.7 RATIO   VLDL 31 0 - 40 mg/dL   LDL Cholesterol 500 (H) 0 - 99 mg/dL  Brain natriuretic peptide  Result Value Ref Range   B Natriuretic Peptide 141.0 (H) 0.0 - 100.0 pg/mL  Troponin I (High Sensitivity)  Result Value Ref Range   Troponin I (High Sensitivity) 831 (HH) <18 ng/L   Laboratory interpretation all normal except leukocytosis, initial troponin is  positive, hypokalemia, hyper glycemia and nonfasting sample, elevated cholesterol and triglycerides    EKG EKG Interpretation  Date/Time:  Wednesday August 09 2019 23:43:28 EDT Ventricular Rate:  76 PR Interval:    QRS Duration: 80 QT Interval:  406 QTC Calculation: 457 R Axis:   51 Text Interpretation: Sinus rhythm Low voltage, precordial leads Minimal ST depression, diffuse leads Since last tracing 25 Jul 2004 Borderline ST elevation, anterior leads Confirmed by Devoria Albe ([id number]) on 08/09/2019 11:46:29 PM   Radiology DG Chest Port 1 View  Result Date: 08/10/2019 CLINICAL DATA:  Chest pain, shortness of breath, cough EXAM: PORTABLE CHEST 1 VIEW COMPARISON:  06/22/2005 FINDINGS: Heart is normal size. No confluent opacities, effusions or edema. No acute bony abnormality. IMPRESSION: No active disease. Electronically Signed   By: Charlett Nose M.D.   On: 08/10/2019 00:07    Procedures .Critical Care Performed by: Devoria Albe, MD Authorized by: Devoria Albe, MD   Critical care provider statement:    Critical care time (minutes):  31   Critical care was necessary to treat or prevent imminent or life-threatening deterioration of the following conditions:  Cardiac failure   Critical care was time spent personally by me on the following activities:  Discussions with consultants, examination of patient, obtaining history from patient or surrogate, ordering and review of laboratory  studies, ordering and review of radiographic studies, pulse oximetry and re-evaluation of patient's condition   (including critical care time)  Medications Ordered in ED Medications  0.9 %  sodium chloride infusion ( Intravenous New Bag/Given 08/09/19 2355)  nitroGLYCERIN 50 mg in dextrose 5 % 250 mL (0.2 mg/mL) infusion (10 mcg/min Intravenous Rate/Dose Change 08/10/19 0016)  ondansetron (ZOFRAN) 4 MG/2ML injection (has no administration in time range)  aspirin chewable tablet 324 mg (324 mg Oral Given 08/09/19 2355)  heparin injection 4,000 Units (4,000 Units Intravenous Given 08/09/19 2354)  morphine 4 MG/ML injection 4 mg (4 mg Intravenous Given by Other 08/10/19 0027)  ondansetron (ZOFRAN) injection 4 mg (4 mg Intravenous Given 08/10/19 0031)    ED Course  I have reviewed the triage vital signs and the nursing notes.  Pertinent labs & imaging results that were available during my care of the patient were reviewed by me and considered in my medical decision making (see chart for details).    MDM Rules/Calculators/A&P                      Code STEMI was called when I saw her EKG, she has new ST elevation in her anteroseptal leads with minimal ST depression inferiorly.  Patient was given aspirin to chew, she was started on nitroglycerin drip, her initial blood pressure was hypertensive at 181/111.  Patient was given the initial bolus of heparin 4000 units.  12:07 AM Dr. Tresa Endo, STEMI cardiologist has accepted patient in transfer.  12:20 AM EMS is here to transport patient to [facility].  Patient states her chest is still hurting a lot and the nitroglycerin has not helped.  She was given morphine 4 mg IV.  Her blood pressure has improved to 148/94 at 10 mcg/minute.  Final Clinical Impression(s) / ED Diagnoses Final diagnoses:  ST elevation myocardial infarction (STEMI), unspecified artery (HCC)    Rx / DC Orders  Plan transfer to [facility] to cath lab  Devoria Albe, MD, Concha Pyo,  MD 08/10/19 [id number]

## 2019-08-10 ENCOUNTER — Inpatient Hospital Stay (HOSPITAL_COMMUNITY): Payer: Self-pay

## 2019-08-10 ENCOUNTER — Encounter (HOSPITAL_COMMUNITY)
Admission: EM | Disposition: A | Discharge status: Home / Self Care | Payer: Self-pay | Source: Home / Self Care | Attending: Cardiovascular Disease

## 2019-08-10 DIAGNOSIS — I2510 Atherosclerotic heart disease of native coronary artery without angina pectoris: Secondary | ICD-10-CM

## 2019-08-10 DIAGNOSIS — I2109 ST elevation (STEMI) myocardial infarction involving other coronary artery of anterior wall: Secondary | ICD-10-CM

## 2019-08-10 DIAGNOSIS — I2102 ST elevation (STEMI) myocardial infarction involving left anterior descending coronary artery: Secondary | ICD-10-CM

## 2019-08-10 HISTORY — PX: LEFT HEART CATH AND CORONARY ANGIOGRAPHY: CATH118249

## 2019-08-10 HISTORY — PX: CORONARY/GRAFT ACUTE MI REVASCULARIZATION: CATH118305

## 2019-08-10 LAB — COMPREHENSIVE METABOLIC PANEL
ALT: 37 U/L (ref 0–44)
AST: 43 U/L — ABNORMAL HIGH (ref 15–41)
Albumin: 4.4 g/dL (ref 3.5–5.0)
Alkaline Phosphatase: 127 U/L — ABNORMAL HIGH (ref 38–126)
Anion gap: 13 (ref 5–15)
BUN: 17 mg/dL (ref 8–23)
CO2: 21 mmol/L — ABNORMAL LOW (ref 22–32)
Calcium: 9 mg/dL (ref 8.9–10.3)
Chloride: 101 mmol/L (ref 98–111)
Creatinine, Ser: 1.04 mg/dL — ABNORMAL HIGH (ref 0.44–1.00)
GFR calc Af Amer: >60 mL/min (ref >60)
GFR calc non Af Amer: 57 mL/min — ABNORMAL LOW (ref >60)
Glucose, Bld: 161 mg/dL — ABNORMAL HIGH (ref 70–99)
Potassium: 3.3 mmol/L — ABNORMAL LOW (ref 3.5–5.1)
Sodium: 135 mmol/L (ref 135–145)
Total Bilirubin: 0.4 mg/dL (ref 0.3–1.2)
Total Protein: 8.5 g/dL — ABNORMAL HIGH (ref 6.5–8.1)

## 2019-08-10 LAB — BASIC METABOLIC PANEL
Anion gap: 14 (ref 5–15)
BUN: 12 mg/dL (ref 8–23)
CO2: 21 mmol/L — ABNORMAL LOW (ref 22–32)
Calcium: 9 mg/dL (ref 8.9–10.3)
Chloride: 104 mmol/L (ref 98–111)
Creatinine, Ser: 0.96 mg/dL (ref 0.44–1.00)
GFR calc Af Amer: >60 mL/min (ref >60)
GFR calc non Af Amer: >60 mL/min (ref >60)
Glucose, Bld: 160 mg/dL — ABNORMAL HIGH (ref 70–99)
Potassium: 3.9 mmol/L (ref 3.5–5.1)
Sodium: 139 mmol/L (ref 135–145)

## 2019-08-10 LAB — CBC WITH DIFFERENTIAL/PLATELET
Abs Immature Granulocytes: 0.03 10*3/uL (ref 0.00–0.07)
Basophils Absolute: 0.1 10*3/uL (ref 0.0–0.1)
Basophils Relative: 1 %
Eosinophils Absolute: 0.4 10*3/uL (ref 0.0–0.5)
Eosinophils Relative: 4 %
HCT: 44.8 % (ref 36.0–46.0)
Hemoglobin: 15.1 g/dL — ABNORMAL HIGH (ref 12.0–15.0)
Immature Granulocytes: 0 %
Lymphocytes Relative: 30 %
Lymphs Abs: 3.4 10*3/uL (ref 0.7–4.0)
MCH: 31.3 pg (ref 26.0–34.0)
MCHC: 33.7 g/dL (ref 30.0–36.0)
MCV: 92.8 fL (ref 80.0–100.0)
Monocytes Absolute: 0.8 10*3/uL (ref 0.1–1.0)
Monocytes Relative: 7 %
Neutro Abs: 6.8 10*3/uL (ref 1.7–7.7)
Neutrophils Relative %: 58 %
Platelets: 352 10*3/uL (ref 150–400)
RBC: 4.83 MIL/uL (ref 3.87–5.11)
RDW: 11.9 % (ref 11.5–15.5)
WBC: 11.5 10*3/uL — ABNORMAL HIGH (ref 4.0–10.5)
nRBC: 0 % (ref 0.0–0.2)

## 2019-08-10 LAB — ECHOCARDIOGRAM COMPLETE
Height: 64 in
Weight: 3082.91 oz

## 2019-08-10 LAB — LIPID PANEL
Cholesterol: 244 mg/dL — ABNORMAL HIGH (ref 0–200)
HDL: 52 mg/dL (ref >40)
LDL Cholesterol: 161 mg/dL — ABNORMAL HIGH (ref 0–99)
Total CHOL/HDL Ratio: 4.7 RATIO
Triglycerides: 154 mg/dL — ABNORMAL HIGH (ref <150)
VLDL: 31 mg/dL (ref 0–40)

## 2019-08-10 LAB — HEMOGLOBIN A1C
Hgb A1c MFr Bld: 5.3 % (ref 4.8–5.6)
Mean Plasma Glucose: 105.41 mg/dL

## 2019-08-10 LAB — TROPONIN I (HIGH SENSITIVITY)
Troponin I (High Sensitivity): 831 ng/L (ref <18)
Troponin I (High Sensitivity): >27000 ng/L (ref <18)
Troponin I (High Sensitivity): >27000 ng/L (ref <18)
Troponin I (High Sensitivity): >27000 ng/L (ref <18)

## 2019-08-10 LAB — APTT: aPTT: 27 seconds (ref 24–36)

## 2019-08-10 LAB — CBC
HCT: 45 % (ref 36.0–46.0)
Hemoglobin: 15.5 g/dL — ABNORMAL HIGH (ref 12.0–15.0)
MCH: 31.6 pg (ref 26.0–34.0)
MCHC: 34.4 g/dL (ref 30.0–36.0)
MCV: 91.6 fL (ref 80.0–100.0)
Platelets: 291 10*3/uL (ref 150–400)
RBC: 4.91 MIL/uL (ref 3.87–5.11)
RDW: 11.9 % (ref 11.5–15.5)
WBC: 12.4 10*3/uL — ABNORMAL HIGH (ref 4.0–10.5)
nRBC: 0 % (ref 0.0–0.2)

## 2019-08-10 LAB — GLUCOSE, CAPILLARY: Glucose-Capillary: 159 mg/dL — ABNORMAL HIGH (ref 70–99)

## 2019-08-10 LAB — PROTIME-INR
INR: 1.1 (ref 0.8–1.2)
Prothrombin Time: 14.4 seconds (ref 11.4–15.2)

## 2019-08-10 LAB — POCT ACTIVATED CLOTTING TIME
Activated Clotting Time: 268 seconds
Activated Clotting Time: 455 seconds

## 2019-08-10 LAB — RESPIRATORY PANEL BY RT PCR (FLU A&B, COVID)
Influenza A by PCR: NEGATIVE
Influenza B by PCR: NEGATIVE
SARS Coronavirus 2 by RT PCR: NEGATIVE

## 2019-08-10 LAB — BRAIN NATRIURETIC PEPTIDE: B Natriuretic Peptide: 141 pg/mL — ABNORMAL HIGH (ref 0.0–100.0)

## 2019-08-10 LAB — MRSA PCR SCREENING: MRSA by PCR: NEGATIVE

## 2019-08-10 SURGERY — CORONARY/GRAFT ACUTE MI REVASCULARIZATION
Anesthesia: LOCAL

## 2019-08-10 MED ORDER — NITROGLYCERIN 1 MG/10 ML FOR IR/CATH LAB
INTRA_ARTERIAL | Status: AC
  Filled 2019-08-10: qty 10

## 2019-08-10 MED ORDER — FENTANYL CITRATE (PF) 100 MCG/2ML IJ SOLN
INTRAMUSCULAR | Status: AC
  Filled 2019-08-10: qty 2

## 2019-08-10 MED ORDER — LIDOCAINE HCL (PF) 1 % IJ SOLN
INTRAMUSCULAR | Status: DC | PRN
  Administered 2019-08-10: 2 mL

## 2019-08-10 MED ORDER — TICAGRELOR 90 MG PO TABS
90.0000 mg | ORAL_TABLET | Freq: Two times a day (BID) | ORAL | Status: DC
  Administered 2019-08-10 – 2019-08-12 (×5): 90 mg via ORAL
  Filled 2019-08-10 (×5): qty 1

## 2019-08-10 MED ORDER — SODIUM CHLORIDE 0.9% FLUSH
3.0000 mL | Freq: Two times a day (BID) | INTRAVENOUS | Status: DC
  Administered 2019-08-10 – 2019-08-12 (×5): 3 mL via INTRAVENOUS

## 2019-08-10 MED ORDER — FENTANYL CITRATE (PF) 100 MCG/2ML IJ SOLN
INTRAMUSCULAR | Status: DC | PRN
  Administered 2019-08-10: 50 ug via INTRAVENOUS

## 2019-08-10 MED ORDER — HEPARIN SODIUM (PORCINE) 1000 UNIT/ML IJ SOLN
INTRAMUSCULAR | Status: AC
  Filled 2019-08-10: qty 1

## 2019-08-10 MED ORDER — SODIUM CHLORIDE 0.9 % IV SOLN: 250.0000 mL | INTRAVENOUS | Status: DC | PRN

## 2019-08-10 MED ORDER — HYDRALAZINE HCL 20 MG/ML IJ SOLN: 10.0000 mg | INTRAMUSCULAR | Status: AC | PRN

## 2019-08-10 MED ORDER — SODIUM CHLORIDE 0.9 % IV SOLN: INTRAVENOUS | Status: AC

## 2019-08-10 MED ORDER — ONDANSETRON HCL 4 MG/2ML IJ SOLN
4.0000 mg | Freq: Four times a day (QID) | INTRAMUSCULAR | Status: DC | PRN
  Administered 2019-08-10 (×3): 4 mg via INTRAVENOUS
  Filled 2019-08-10 (×3): qty 2

## 2019-08-10 MED ORDER — TICAGRELOR 90 MG PO TABS
ORAL_TABLET | ORAL | Status: DC | PRN
  Administered 2019-08-10: 180 mg via ORAL

## 2019-08-10 MED ORDER — NITROGLYCERIN 1 MG/10 ML FOR IR/CATH LAB
INTRA_ARTERIAL | Status: DC | PRN
  Administered 2019-08-10 (×2): 200 ug via INTRACORONARY

## 2019-08-10 MED ORDER — ENOXAPARIN SODIUM 40 MG/0.4ML ~~LOC~~ SOLN
40.0000 mg | Freq: Every day | SUBCUTANEOUS | Status: DC
  Administered 2019-08-11: 40 mg via SUBCUTANEOUS
  Filled 2019-08-10 (×2): qty 0.4

## 2019-08-10 MED ORDER — ASPIRIN 81 MG PO CHEW
81.0000 mg | CHEWABLE_TABLET | Freq: Every day | ORAL | Status: DC
  Administered 2019-08-10 – 2019-08-12 (×3): 81 mg via ORAL
  Filled 2019-08-10 (×3): qty 1

## 2019-08-10 MED ORDER — MIDAZOLAM HCL 2 MG/2ML IJ SOLN
INTRAMUSCULAR | Status: AC
  Filled 2019-08-10: qty 2

## 2019-08-10 MED ORDER — HEPARIN (PORCINE) IN NACL 1000-0.9 UT/500ML-% IV SOLN
INTRAVENOUS | Status: DC | PRN
  Administered 2019-08-10 (×3): 500 mL

## 2019-08-10 MED ORDER — HEPARIN (PORCINE) IN NACL 1000-0.9 UT/500ML-% IV SOLN
INTRAVENOUS | Status: AC
  Filled 2019-08-10: qty 1500

## 2019-08-10 MED ORDER — FUROSEMIDE 10 MG/ML IJ SOLN
40.0000 mg | Freq: Once | INTRAMUSCULAR | Status: AC
  Administered 2019-08-10: 40 mg via INTRAVENOUS
  Filled 2019-08-10: qty 4

## 2019-08-10 MED ORDER — ONDANSETRON HCL 4 MG/2ML IJ SOLN
INTRAMUSCULAR | Status: AC
  Filled 2019-08-10: qty 2

## 2019-08-10 MED ORDER — ACETAMINOPHEN 325 MG PO TABS: 650.0000 mg | ORAL_TABLET | ORAL | Status: DC | PRN

## 2019-08-10 MED ORDER — METOPROLOL SUCCINATE ER 25 MG PO TB24
25.0000 mg | ORAL_TABLET | Freq: Every day | ORAL | Status: DC
  Administered 2019-08-10 – 2019-08-11 (×2): 25 mg via ORAL
  Filled 2019-08-10 (×2): qty 1

## 2019-08-10 MED ORDER — IOHEXOL 350 MG/ML SOLN
INTRAVENOUS | Status: DC | PRN
  Administered 2019-08-10: 03:00:00 205 mL via INTRA_ARTERIAL

## 2019-08-10 MED ORDER — CHLORHEXIDINE GLUCONATE CLOTH 2 % EX PADS
6.0000 | MEDICATED_PAD | Freq: Every day | CUTANEOUS | Status: DC
  Administered 2019-08-11 – 2019-08-12 (×2): 6 via TOPICAL

## 2019-08-10 MED ORDER — PERFLUTREN LIPID MICROSPHERE
INTRAVENOUS | Status: AC
  Administered 2019-08-10: 3 mg
  Filled 2019-08-10: qty 10

## 2019-08-10 MED ORDER — SODIUM CHLORIDE 0.9 % IV SOLN
INTRAVENOUS | Status: AC | PRN
  Administered 2019-08-10: 10 mL/h via INTRAVENOUS

## 2019-08-10 MED ORDER — VERAPAMIL HCL 2.5 MG/ML IV SOLN
INTRAVENOUS | Status: DC | PRN
  Administered 2019-08-10: 10 mL via INTRA_ARTERIAL

## 2019-08-10 MED ORDER — HEPARIN SODIUM (PORCINE) 1000 UNIT/ML IJ SOLN
INTRAMUSCULAR | Status: DC | PRN
  Administered 2019-08-10: 5000 [IU] via INTRAVENOUS
  Administered 2019-08-10: 4000 [IU] via INTRAVENOUS

## 2019-08-10 MED ORDER — ONDANSETRON HCL 4 MG/2ML IJ SOLN
4.0000 mg | Freq: Once | INTRAMUSCULAR | Status: AC
  Administered 2019-08-10: 4 mg via INTRAVENOUS

## 2019-08-10 MED ORDER — TICAGRELOR 90 MG PO TABS
ORAL_TABLET | ORAL | Status: AC
  Filled 2019-08-10: qty 2

## 2019-08-10 MED ORDER — IOHEXOL 350 MG/ML SOLN
INTRAVENOUS | Status: AC
  Filled 2019-08-10: qty 1

## 2019-08-10 MED ORDER — SODIUM CHLORIDE 0.9 % IV SOLN: INTRAVENOUS | Status: DC

## 2019-08-10 MED ORDER — LIDOCAINE HCL (PF) 1 % IJ SOLN
INTRAMUSCULAR | Status: AC
  Filled 2019-08-10: qty 30

## 2019-08-10 MED ORDER — MIDAZOLAM HCL 2 MG/2ML IJ SOLN
INTRAMUSCULAR | Status: DC | PRN
  Administered 2019-08-10: 2 mg via INTRAVENOUS

## 2019-08-10 MED ORDER — ALUM & MAG HYDROXIDE-SIMETH 200-200-20 MG/5ML PO SUSP
30.0000 mL | ORAL | Status: DC | PRN
  Administered 2019-08-10: 30 mL via ORAL
  Filled 2019-08-10: qty 30

## 2019-08-10 MED ORDER — LABETALOL HCL 5 MG/ML IV SOLN: 10.0000 mg | INTRAVENOUS | Status: AC | PRN

## 2019-08-10 MED ORDER — VERAPAMIL HCL 2.5 MG/ML IV SOLN
INTRAVENOUS | Status: AC
  Filled 2019-08-10: qty 2

## 2019-08-10 MED ORDER — ATORVASTATIN CALCIUM 80 MG PO TABS
80.0000 mg | ORAL_TABLET | Freq: Every day | ORAL | Status: DC
  Administered 2019-08-10 – 2019-08-12 (×3): 80 mg via ORAL
  Filled 2019-08-10 (×3): qty 1

## 2019-08-10 MED ORDER — SODIUM CHLORIDE 0.9% FLUSH: 3.0000 mL | INTRAVENOUS | Status: DC | PRN

## 2019-08-10 MED ORDER — DIAZEPAM 5 MG PO TABS: 5.0000 mg | ORAL_TABLET | ORAL | Status: DC | PRN

## 2019-08-10 MED ORDER — MORPHINE SULFATE (PF) 4 MG/ML IV SOLN
4.0000 mg | Freq: Once | INTRAVENOUS | Status: AC
  Administered 2019-08-10: 4 mg via INTRAVENOUS
  Filled 2019-08-10: qty 1

## 2019-08-10 MED FILL — Perflutren Lipid Microsphere IV Susp 1.1 MG/ML: INTRAVENOUS | Qty: 10 | Status: AC

## 2019-08-10 SURGICAL SUPPLY — 21 items
BALLN SAPPHIRE 1.5X12 (BALLOONS) ×2
BALLN SAPPHIRE 2.0X12 (BALLOONS) ×2
BALLN SAPPHIRE ~~LOC~~ 3.25X15 (BALLOONS) ×2 IMPLANT
BALLOON SAPPHIRE 1.5X12 (BALLOONS) ×1 IMPLANT
BALLOON SAPPHIRE 2.0X12 (BALLOONS) ×1 IMPLANT
CATH LAUNCHER 6FR EBU 3.5 SH (CATHETERS) ×2 IMPLANT
CATH OPTITORQUE TIG 4.0 5F (CATHETERS) ×2 IMPLANT
CATH VISTA GUIDE 6FR XB3.5 (CATHETERS) ×2 IMPLANT
DEVICE RAD COMP TR BAND LRG (VASCULAR PRODUCTS) ×2 IMPLANT
ELECT DEFIB PAD ADLT CADENCE (PAD) ×2 IMPLANT
GLIDESHEATH SLEND SS 6F .021 (SHEATH) ×2 IMPLANT
GUIDEWIRE INQWIRE 1.5J.035X260 (WIRE) ×1 IMPLANT
INQWIRE 1.5J .035X260CM (WIRE) ×2
KIT ENCORE 26 ADVANTAGE (KITS) ×2 IMPLANT
KIT HEART LEFT (KITS) ×2 IMPLANT
PACK CARDIAC CATHETERIZATION (CUSTOM PROCEDURE TRAY) ×2 IMPLANT
STENT RESOLUTE ONYX 3.0X30 (Permanent Stent) ×2 IMPLANT
TRANSDUCER W/STOPCOCK (MISCELLANEOUS) ×2 IMPLANT
TUBING CIL FLEX 10 FLL-RA (TUBING) ×2 IMPLANT
WIRE COUGAR XT STRL 190CM (WIRE) ×2 IMPLANT
WIRE PT2 MS 185 (WIRE) ×2 IMPLANT

## 2019-08-10 NOTE — Progress Notes (Signed)
   Difficulty getting the wrist band off without oozing.  Discussed with Cath Lab staff, who will evaluate, reposition band, and or place a new band and restart the weaning process.  Patient is asymptomatic, having no chest discomfort.

## 2019-08-10 NOTE — ED Notes (Signed)
Date and time results received: 08/10/19 0048 (use smartphrase ".now" to insert current time)  Test: trop  Critical Value: 831  Name of Provider Notified: Dr. Lynelle Doctor  Orders Received? Or Actions Taken?: see chart

## 2019-08-10 NOTE — H&P (Signed)
Cardiology History & Physical    Patient ID: JERICHO CIESLIK MRN: [id number], DOB/AGE: [birth year]   Admit date: 08/09/2019  Primary Physician: Assunta Found, MD Primary Cardiologist: No primary care provider on file.  Patient Profile    Gloria Thompson is a 64 year old woman with HTN, hyperlipidemia, and minimal prox LAD disease on 2006 cath for chest pain. She presented to [facility] ED today for evaluation of chest pain and dyspnea, found to have anteroseptal ST elevation so CODE STEMI activated.  History of Present Illness    She reports having intermittent chest pain episodes for the last couple of months, today more consistently with any exertion, resolving with rest until 2 hours ago when the pain returned and persisted. Pain radiates to her left arm and is associated with nausea, vomiting, diaphoresis, and then developed dyspnea which ultimately prompted her to come to the ED. Hands and feet have been swelling lately as well. Very strong family history of CAD, nonsmoker.  Presented to [facility] ED hypertensive with ongoing chest pain. ECG showed ST elevation in septal leads with reciprocal changes and was accepted by Dr. Tresa Endo as a CODE STEMI. She was given ASA 324mg , heparin 4000 units, morphine, zofran, and started on nitro ggt prior to transfer. On arrival to the cath lab she was having mild ongoing chest pain with nitro at 10, hemodynamically stable. SpO2 began gradually dropping as low as the upper 70s at the beginning of the case with sedation, but she was in no distress and improved with additional oxygen. Angiography revealed thrombotic occlusion of a large LAD distal to large septal and diag. Difficulty advancing balloon across lesion, but ultimately successful. PCI performed with 3x28mm DES resulting in TIMI 3 flow. Chest pain free at end of procedure. She does have some mild orthopnea, LVEDP 25.    Past Medical History   Past Medical History:  Diagnosis Date  . Acid reflux   .  Hyperlipidemia   . Hypertension     Past Surgical History:  Procedure Laterality Date  . APPENDECTOMY    . CESAREAN SECTION  31m  . kidney stones       Allergies Allergies  Allergen Reactions  . Other     States allergies to multiple antibiotics but NAMES UNKNOWN.  2440,1027 Codeine Palpitations    Home Medications    Prior to Admission medications   Medication Sig Start Date End Date Taking? Authorizing Provider  acetaminophen (TYLENOL ARTHRITIS PAIN) 650 MG CR tablet Take 650 mg by mouth daily.    [provider]  acetaminophen (TYLENOL) 325 MG tablet Take 650 mg by mouth as needed.    [provider]  aspirin 325 MG tablet Take 325 mg by mouth daily.    [provider]  esomeprazole (NEXIUM) 20 MG capsule Take 20 mg by mouth daily before breakfast.      [provider]  losartan (COZAAR) 100 MG tablet Take 1 tablet (100 mg total) by mouth daily. 05/13/15   05/15/15, NP  meloxicam (MOBIC) 7.5 MG tablet Take 7.5 mg by mouth 2 (two) times daily as needed for pain.    [provider]  simvastatin (ZOCOR) 20 MG tablet Take 20 mg by mouth daily.    [provider]    Family History    Family History  Problem Relation Age of Onset  . Heart disease Mother   . Heart attack Father   . Diabetes Sister   . Heart disease  Brother   . Hypertension Brother   . Hypertension Sister   . Hyperlipidemia Sister   . COPD Sister   . Hypertension Sister   . Hyperlipidemia Sister   . Heart disease Brother   . Hypertension Brother   . Hypertension Brother   . Heart disease Brother   . Heart disease Brother   . Hypertension Brother    She indicated that her mother is deceased. She indicated that her father is deceased. She indicated that two of her three sisters are alive. She indicated that all of her five brothers are alive. She indicated that her maternal grandmother is deceased. She indicated that her maternal grandfather is  deceased. She indicated that her paternal grandmother is deceased. She indicated that her paternal grandfather is deceased. She indicated that her daughter is alive. She indicated that both of her sons are alive.   Social History    Social History   Socioeconomic History  . Marital status: Widowed    Spouse name: Not on file  . Number of children: Not on file  . Years of education: Not on file  . Highest education level: Not on file  Occupational History  . Not on file  Tobacco Use  . Smoking status: Never Smoker  . Smokeless tobacco: Never Used  Substance and Sexual Activity  . Alcohol use: No  . Drug use: No  . Sexual activity: Not Currently    Birth control/protection: Post-menopausal  Other Topics Concern  . Not on file  Social History Narrative  . Not on file   Social Determinants of Health   Financial Resource Strain:   . Difficulty of Paying Living Expenses:   Food Insecurity:   . Worried About Charity fundraiser in the Last Year:   . Arboriculturist in the Last Year:   Transportation Needs:   . Film/video editor (Medical):   Marland Kitchen Lack of Transportation (Non-Medical):   Physical Activity:   . Days of Exercise per Week:   . Minutes of Exercise per Session:   Stress:   . Feeling of Stress :   Social Connections:   . Frequency of Communication with Friends and Family:   . Frequency of Social Gatherings with Friends and Family:   . Attends Religious Services:   . Active Member of Clubs or Organizations:   . Attends Archivist Meetings:   Marland Kitchen Marital Status:   Intimate Partner Violence:   . Fear of Current or Ex-Partner:   . Emotionally Abused:   Marland Kitchen Physically Abused:   . Sexually Abused:      Review of Systems    General:  No chills, fever, night sweats or weight changes.  Cardiovascular:  No chest pain, dyspnea on exertion, edema, orthopnea, palpitations, paroxysmal nocturnal dyspnea. Dermatological: No rash, lesions/masses Respiratory: No  cough, dyspnea Urologic: No hematuria, dysuria Abdominal:   No nausea, vomiting, diarrhea, bright red blood per rectum, melena, or hematemesis Neurologic:  No visual changes, wkns, changes in mental status. All other systems reviewed and are otherwise negative except as noted above.  Physical Exam    BP (!) 148/94   Pulse 73   Temp (!) 97.5 F (36.4 C) (Oral)   Resp 20   Ht 5\' 4"  (1.626 m)   Wt 83.9 kg   SpO2 90%   BMI 31.76 kg/m  General: Alert, NAD HEENT: Normal  Neck: No bruits or JVD. Lungs:  Resp regular and unlabored, CTA bilaterally. Heart: Regular rhythm,  no s3, s4, or murmurs. Abdomen: Soft, non-tender, non-distended, BS +.  Extremities: Warm. No clubbing, cyanosis or edema. DP/PT/Radials 2+ and equal bilaterally. Psych: Normal affect. Neuro: Alert and oriented. No gross focal deficits. No abnormal movements.  Labs    Cardiac Panel (last 3 results) Recent Labs    08/09/19 2347  TROPONINIHS 831*    Lab Results  Component Value Date   WBC 11.5 (H) 08/09/2019   HGB 15.1 (H) 08/09/2019   HCT 44.8 08/09/2019   MCV 92.8 08/09/2019   PLT 352 08/09/2019    Recent Labs  Lab 08/09/19 2347  NA 135  K 3.3*  CL 101  CO2 21*  BUN 17  CREATININE 1.04*  CALCIUM 9.0  PROT 8.5*  BILITOT 0.4  ALKPHOS 127*  ALT 37  AST 43*  GLUCOSE 161*   Lab Results  Component Value Date   CHOL 244 (H) 08/09/2019   HDL 52 08/09/2019   LDLCALC 161 (H) 08/09/2019   TRIG 154 (H) 08/09/2019     Radiology Studies    DG Chest Port 1 View  Result Date: 08/10/2019 CLINICAL DATA:  Chest pain, shortness of breath, cough EXAM: PORTABLE CHEST 1 VIEW COMPARISON:  06/22/2005 FINDINGS: Heart is normal size. No confluent opacities, effusions or edema. No acute bony abnormality. IMPRESSION: No active disease. Electronically Signed   By: Charlett Nose M.D.   On: 08/10/2019 00:07    ECG & Cardiac Imaging    ECG: NSR, anteroseptal ST elevation with reciprocal changes - personally  reviewed.  Assessment & Plan    Anterior STEMI: - s/p 3x60mm DES to mid LAD, TIMI 3 flow, chest pain free - DAPT for at least 1 year - Atorvastatin 80mg  - Metoprolol XL 25mg  - Off nitro ggt - Lasix 40mg  IV x 1 - TTE, lipids, A1c - Monitor on telemetry  Hypertension: - Now off nitro, currently normotensive - Consider restarting ARB tomorrow, was on losartan 100mg  at home - Metoprolol as above  Nutrition: Heart healthy diet DVT ppx: Lovenox Advanced Care Planning: Full Code   Signed, , MD 08/10/2019, 12:30 AM

## 2019-08-10 NOTE — Progress Notes (Signed)
Echocardiogram 2D Echocardiogram has been performed.  Warren Lacy Tiara Maultsby 08/10/2019, 8:52 AM

## 2019-08-10 NOTE — Progress Notes (Signed)
Began discussing education with pt. Discussed MI, stent, Brilinta, restrictions, and diet. Pt receptive. Will f/u tomorrow for ambulation and further education. [id number] Ethelda Chick CES, ACSM 3:11 PM 08/10/2019

## 2019-08-11 DIAGNOSIS — E785 Hyperlipidemia, unspecified: Secondary | ICD-10-CM

## 2019-08-11 DIAGNOSIS — I5021 Acute systolic (congestive) heart failure: Secondary | ICD-10-CM

## 2019-08-11 LAB — BRAIN NATRIURETIC PEPTIDE: B Natriuretic Peptide: 1023 pg/mL — ABNORMAL HIGH (ref 0.0–100.0)

## 2019-08-11 MED ORDER — SPIRONOLACTONE 12.5 MG HALF TABLET
12.5000 mg | ORAL_TABLET | Freq: Every day | ORAL | Status: DC
  Administered 2019-08-12: 12.5 mg via ORAL
  Filled 2019-08-11: qty 1

## 2019-08-11 MED ORDER — METOPROLOL SUCCINATE ER 50 MG PO TB24
50.0000 mg | ORAL_TABLET | Freq: Once | ORAL | Status: AC
  Administered 2019-08-11: 50 mg via ORAL
  Filled 2019-08-11: qty 1

## 2019-08-11 MED ORDER — LOSARTAN POTASSIUM 25 MG PO TABS
12.5000 mg | ORAL_TABLET | Freq: Every day | ORAL | Status: DC
  Administered 2019-08-11 – 2019-08-12 (×2): 12.5 mg via ORAL
  Filled 2019-08-11 (×2): qty 1

## 2019-08-11 MED ORDER — ATORVASTATIN CALCIUM 80 MG PO TABS: 80.0000 mg | ORAL_TABLET | Freq: Every day | ORAL | 6 refills | Status: DC

## 2019-08-11 MED ORDER — METOPROLOL SUCCINATE ER 100 MG PO TB24
100.0000 mg | ORAL_TABLET | Freq: Every day | ORAL | Status: DC
  Administered 2019-08-12: 100 mg via ORAL
  Filled 2019-08-11: qty 1

## 2019-08-11 MED ORDER — ASPIRIN EC 81 MG PO TBEC: 81.0000 mg | DELAYED_RELEASE_TABLET | Freq: Every day | ORAL | 6 refills | Status: AC

## 2019-08-11 MED ORDER — SPIRONOLACTONE 25 MG PO TABS: 12.5000 mg | ORAL_TABLET | Freq: Every day | ORAL | 6 refills | Status: DC

## 2019-08-11 MED ORDER — LOSARTAN POTASSIUM 25 MG PO TABS: 12.5000 mg | ORAL_TABLET | Freq: Every day | ORAL | 6 refills | Status: DC

## 2019-08-11 MED ORDER — METOPROLOL SUCCINATE ER 100 MG PO TB24: 100.0000 mg | ORAL_TABLET | Freq: Every day | ORAL | 6 refills | Status: DC

## 2019-08-11 MED ORDER — NITROGLYCERIN 0.4 MG SL SUBL: 0.4000 mg | SUBLINGUAL_TABLET | SUBLINGUAL | 12 refills | Status: DC | PRN

## 2019-08-11 MED ORDER — TICAGRELOR 90 MG PO TABS: 90.0000 mg | ORAL_TABLET | Freq: Two times a day (BID) | ORAL | 11 refills | Status: DC

## 2019-08-11 MED FILL — BRILINTA 90 MG TABLET: 90 | 30 days supply | Qty: 60 | Fill #0

## 2019-08-11 MED FILL — METOPROLOL SUCCINATE ER 100: 100 | 30 days supply | Qty: 30 | Fill #0

## 2019-08-11 MED FILL — ATORVASTATIN CALCIUM 80 MG: 80 | 30 days supply | Qty: 30 | Fill #0

## 2019-08-11 MED FILL — ASPIRIN LOW DOSE 81 MG TBEC: 81 | 30 days supply | Qty: 30 | Fill #0

## 2019-08-11 MED FILL — NITROGLYCERIN 0.4 MG TAB SL: 0.4 | 8 days supply | Qty: 25 | Fill #0

## 2019-08-11 MED FILL — SPIRONOLACTONE 25 MG TABLET: 25 | 30 days supply | Qty: 15 | Fill #0

## 2019-08-11 MED FILL — LOSARTAN POTASSIUM 25 MG TA: 25 | 30 days supply | Qty: 30 | Fill #0

## 2019-08-11 NOTE — Progress Notes (Signed)
Progress Note  Patient Name: Gloria Thompson Date of Encounter: 08/11/2019  Primary Cardiologist: No primary care provider on file. Nicki Guadalajara, MD  Subjective   She is asymptomatic.  She denies chest pain.  Says she felt great this morning until she got up to walk with rehab.  No trouble while walking.  After getting her medication she began to have some weakness and started feeling somewhat short of breath.  She was unable to correlate whether Brilinta had a role in dyspnea.  Inpatient Medications    Scheduled Meds: . aspirin  81 mg Oral Daily  . atorvastatin  80 mg Oral Daily  . Chlorhexidine Gluconate Cloth  6 each Topical Daily  . enoxaparin (LOVENOX) injection  40 mg Subcutaneous Daily  . metoprolol succinate  25 mg Oral Daily  . sodium chloride flush  3 mL Intravenous Q12H  . ticagrelor  90 mg Oral BID   Continuous Infusions: . sodium chloride Stopped (08/10/19 0416)  . sodium chloride    . nitroGLYCERIN Stopped (08/10/19 0414)   PRN Meds: sodium chloride, acetaminophen, alum & mag hydroxide-simeth, diazepam, ondansetron (ZOFRAN) IV, sodium chloride flush   Vital Signs    Vitals:   08/11/19 1100 08/11/19 1132 08/11/19 1200 08/11/19 1300  BP: 117/71  110/80 110/71  Pulse: 92  (!) 112 94  Resp: 20  (!) 24 14  Temp:  97.9 F (36.6 C)    TempSrc:  Oral    SpO2: 98%  96% 95%  Weight:      Height:        Intake/Output Summary (Last 24 hours) at 08/11/2019 1306 Last data filed at 08/11/2019 1200 Gross per 24 hour  Intake 1455.64 ml  Output 1800 ml  Net -344.36 ml   Last 3 Weights 08/11/2019 08/10/2019 08/09/2019  Weight (lbs) 189 lb 2.5 oz 192 lb 10.9 oz 185 lb  Weight (kg) 85.8 kg 87.4 kg 83.915 kg      Telemetry    Normal sinus rhythm with occasional PVC.- Personally Reviewed  ECG    Performed today.- Personally Reviewed  Physical Exam  Poor dentition.  Morbidly obese. GEN: No acute distress.   Neck: No JVD Cardiac: RRR, no murmurs, rubs.  S4  gallops.  Right radial access site is unremarkable. Respiratory: Clear to auscultation bilaterally. GI: Soft, nontender, non-distended  MS: No edema; No deformity. Neuro:  Nonfocal  Psych: Normal affect   Labs    High Sensitivity Troponin:   Recent Labs  Lab 08/09/19 2347 08/10/19 0618 08/10/19 0933 08/10/19 1222  TROPONINIHS 831* >27,000* >27,000* >27,000*      Chemistry Recent Labs  Lab 08/09/19 2347 08/10/19 0618  NA 135 139  K 3.3* 3.9  CL 101 104  CO2 21* 21*  GLUCOSE 161* 160*  BUN 17 12  CREATININE 1.04* 0.96  CALCIUM 9.0 9.0  PROT 8.5*  --   ALBUMIN 4.4  --   AST 43*  --   ALT 37  --   ALKPHOS 127*  --   BILITOT 0.4  --   GFRNONAA 57* >60  GFRAA >60 >60  ANIONGAP 13 14     Hematology Recent Labs  Lab 08/09/19 2347 08/10/19 0618  WBC 11.5* 12.4*  RBC 4.83 4.91  HGB 15.1* 15.5*  HCT 44.8 45.0  MCV 92.8 91.6  MCH 31.3 31.6  MCHC 33.7 34.4  RDW 11.9 11.9  PLT 352 291    BNP Recent Labs  Lab 08/09/19 2357  BNP 141.0*  DDimer No results for input(s): DDIMER in the last 168 hours.   Radiology    CARDIAC CATHETERIZATION  Result Date: 08/10/2019  1st Mrg lesion is 60% stenosed.  Prox LAD to Mid LAD lesion is 100% stenosed.  Post intervention, there is a 0% residual stenosis.  A stent was successfully placed.  Acute ST segment elevation myocardial infarction secondary to total proximal occlusion of the LAD which arises from a common ostium or separate ostium from the circumflex vessel. Dominant left circumflex vessel with 60% ostial narrowing of a very proximal marginal vessel.  There is a high diagonal/ramus immediate-like vessel which arises more proximally. Normal RCA with very small PDA. LVEDP 25 mmHg Very difficult but ultimately successful PCI to a totally occluded LAD with ultimate insertion of a 3.0 x 30 mm Resolute Onyx stent postdilated to 3.34 mm with the long occluded segment being reduced to 0% and resumption of brisk TIMI-3  flow in a large LAD system which wraps around the LV apex to supply the distal third of the inferior wall. RECOMMENDATION: DAPT for minimum of 1 year.  Aggressive lipid-lowering therapy with target LDL less than 70.  Optimal blood pressure control.  A 2D echo Doppler study will be done to assess LV function.   DG Chest Port 1 View  Result Date: 08/10/2019 CLINICAL DATA:  Chest pain, shortness of breath, cough EXAM: PORTABLE CHEST 1 VIEW COMPARISON:  06/22/2005 FINDINGS: Heart is normal size. No confluent opacities, effusions or edema. No acute bony abnormality. IMPRESSION: No active disease. Electronically Signed   By: Charlett Nose M.D.   On: 08/10/2019 00:07   ECHOCARDIOGRAM COMPLETE  Result Date: 08/10/2019    ECHOCARDIOGRAM REPORT   Patient Name:   Gloria Thompson Date of Exam: 08/10/2019 Medical Rec #:  [id number]         Height:       64.0 in Accession #:    [id number]        Weight:       192.7 lb Date of Birth:  11/12/1955         BSA:          1.926 m Patient Age:    64 years          BP:           122/80 mmHg Patient Gender: F                 HR:           87 bpm. Exam Location:  Inpatient Procedure: 2D Echo, Color Doppler, Cardiac Doppler and Intracardiac            Opacification Agent Indications:    Acute MI 410  History:        Patient has no prior history of Echocardiogram examinations.                 STEMI; Risk Factors:Hypertension.  Sonographer:    Irving Burton Senior RDCS Referring Phys: [phone] THOMAS A KELLY  Sonographer Comments: Technically difficult study due to poor echo windows, unable to turn due to post-cath restrictions. IMPRESSIONS  1. Left ventricular ejection fraction, by estimation, is 25%. The left ventricle has severely decreased function. The left ventricle demonstrates regional wall motion abnormalities. Akinesis of the mid to apical anteroseptal, inferoseptal, and anterior walls. Akinesis of the apical inferior and apical lateral walls. Akinesis of the true apex. No LV thrombus  visualized. There is mild left ventricular hypertrophy. Left ventricular diastolic parameters  are consistent with Grade I diastolic dysfunction (impaired relaxation).  2. Right ventricular systolic function is normal. The right ventricular size is normal. Tricuspid regurgitation signal is inadequate for assessing PA pressure.  3. The aortic valve is tricuspid. Aortic valve regurgitation is not visualized. Mild aortic valve sclerosis is present, with no evidence of aortic valve stenosis.  4. The inferior vena cava is normal in size with greater than 50% respiratory variability, suggesting right atrial pressure of 3 mmHg.  5. The mitral valve is normal in structure. No evidence of mitral valve regurgitation. No evidence of mitral stenosis. FINDINGS  Left Ventricle: Left ventricular ejection fraction, by estimation, is 25%. The left ventricle has severely decreased function. The left ventricle demonstrates regional wall motion abnormalities. Definity contrast agent was given IV to delineate the left  ventricular endocardial borders. The left ventricular internal cavity size was normal in size. There is mild left ventricular hypertrophy. Left ventricular diastolic parameters are consistent with Grade I diastolic dysfunction (impaired relaxation). Right Ventricle: The right ventricular size is normal. No increase in right ventricular wall thickness. Right ventricular systolic function is normal. Tricuspid regurgitation signal is inadequate for assessing PA pressure. Left Atrium: Left atrial size was normal in size. Right Atrium: Right atrial size was normal in size. Pericardium: There is no evidence of pericardial effusion. Mitral Valve: The mitral valve is normal in structure. There is mild calcification of the mitral valve leaflet(s). No evidence of mitral valve regurgitation. No evidence of mitral valve stenosis. Tricuspid Valve: The tricuspid valve is normal in structure. Tricuspid valve regurgitation is not  demonstrated. Aortic Valve: The aortic valve is tricuspid. Aortic valve regurgitation is not visualized. Mild aortic valve sclerosis is present, with no evidence of aortic valve stenosis. Pulmonic Valve: The pulmonic valve was normal in structure. Pulmonic valve regurgitation is trivial. Aorta: The aortic root is normal in size and structure. Venous: The inferior vena cava is normal in size with greater than 50% respiratory variability, suggesting right atrial pressure of 3 mmHg. IAS/Shunts: No atrial level shunt detected by color flow Doppler.  LEFT VENTRICLE PLAX 2D LVIDd:         3.60 cm  Diastology LVIDs:         2.80 cm  LV e' lateral:   2.61 cm/s LV PW:         0.70 cm  LV E/e' lateral: 20.3 LV IVS:        1.40 cm  LV e' medial:    4.24 cm/s LVOT diam:     1.70 cm  LV E/e' medial:  12.5 LV SV:         45 LV SV Index:   23 LVOT Area:     2.27 cm  RIGHT VENTRICLE RV S prime:     6.42 cm/s TAPSE (M-mode): 1.1 cm LEFT ATRIUM             Index LA diam:        2.40 cm 1.25 cm/m LA Vol (A2C):   41.8 ml 21.71 ml/m LA Vol (A4C):   40.5 ml 21.03 ml/m LA Biplane Vol: 44.7 ml 23.21 ml/m  AORTIC VALVE LVOT Vmax:   102.00 cm/s LVOT Vmean:  77.700 cm/s LVOT VTI:    0.197 m  AORTA Ao Root diam: 3.40 cm Ao Asc diam:  3.20 cm MITRAL VALVE MV Area (PHT): 3.54 cm     SHUNTS MV Decel Time: 214 msec     Systemic VTI:  0.20 m MV E velocity:  53.10 cm/s   Systemic Diam: 1.70 cm MV A velocity: 103.00 cm/s MV E/A ratio:  0.52 Loralie Champagne MD Electronically signed by Loralie Champagne MD Signature Date/Time: 08/10/2019/2:17:59 PM    Final     Cardiac Studies   2D Doppler echocardiogram 08/10/2019: IMPRESSIONS    1. Left ventricular ejection fraction, by estimation, is 25%. The left  ventricle has severely decreased function. The left ventricle demonstrates  regional wall motion abnormalities. Akinesis of the mid to apical  anteroseptal, inferoseptal, and anterior  walls. Akinesis of the apical inferior and apical lateral  walls. Akinesis  of the true apex. No LV thrombus visualized. There is mild left  ventricular hypertrophy. Left ventricular diastolic parameters are  consistent with Grade I diastolic dysfunction  (impaired relaxation).  2. Right ventricular systolic function is normal. The right ventricular  size is normal. Tricuspid regurgitation signal is inadequate for assessing  PA pressure.  3. The aortic valve is tricuspid. Aortic valve regurgitation is not  visualized. Mild aortic valve sclerosis is present, with no evidence of  aortic valve stenosis.  4. The inferior vena cava is normal in size with greater than 50%  respiratory variability, suggesting right atrial pressure of 3 mmHg.  5. The mitral valve is normal in structure. No evidence of mitral valve  regurgitation. No evidence of mitral stenosis.   Coronary angiography/PCI 08/10/2019: Diagnostic Dominance: Right  Intervention    Patient Profile     64 y.o. female with HTN, hyperlipidemia, and minimal prox LAD  (2006 cath) for chest pain, who presented with anteroseptal STEMI and received mechanical revascularization by Dr. Claiborne Billings on 08/10/2019 (early a.m) receiving an LAD drug-eluting stent..  Assessment & Plan    1. Anterior ST segment elevation myocardial infarction: Treated with mechanical reperfusion and stenting of the proximal to mid LAD.  Other arteries are widely patent.  Dual antiplatelet therapy is currently being used as is high intensity statin therapy. 2. Acute systolic heart failure: LVEF is less than 25% based on echo.  Low-dose beta-blocker and ARB therapy has been initiated.  In a.m., will initiate low-dose mineralocorticoid receptor antagonist.  Depending upon blood pressure response, consider discontinuation of ARB and starting Entresto prior to discharge. 3. Essential hypertension: Blood pressure is adequately controlled at the present time. 4. Hyperlipidemia: Target LDL less than 70.  High intensity statin  therapy is initiated.  For questions or updates, please contact [place] Please consult [email or web address] for contact info under        Signed, Sinclair Grooms, MD  08/11/2019, 1:06 PM

## 2019-08-11 NOTE — Progress Notes (Signed)
Ambulated in hallway, gait steady, HR 120-130, denies chest pain or pressure, admits to slight/brief dizziness upon first standing. Back to room and settled into recliner, HR back to 100-110.

## 2019-08-11 NOTE — Plan of Care (Signed)
  Problem: Health Behavior/Discharge Planning: Goal: Ability to manage health-related needs will improve Outcome: Progressing   Problem: Clinical Measurements: Goal: Will remain free from infection Outcome: Progressing Goal: Diagnostic test results will improve Outcome: Progressing Goal: Cardiovascular complication will be avoided Outcome: Progressing   Problem: Activity: Goal: Risk for activity intolerance will decrease Outcome: Progressing   Problem: Elimination: Goal: Will not experience complications related to bowel motility Outcome: Progressing   Problem: Pain Managment: Goal: General experience of comfort will improve Outcome: Progressing   Problem: Safety: Goal: Ability to remain free from injury will improve Outcome: Progressing   Problem: Skin Integrity: Goal: Risk for impaired skin integrity will decrease Outcome: Progressing   Problem: Education: Goal: Understanding of cardiac disease, CV risk reduction, and recovery process will improve Outcome: Progressing Goal: Understanding of medication regimen will improve Outcome: Progressing   Problem: Cardiac: Goal: Vascular access site(s) Level 0-1 will be maintained Outcome: Progressing   Problem: Health Behavior/Discharge Planning: Goal: Ability to safely manage health-related needs after discharge will improve Outcome: Progressing

## 2019-08-11 NOTE — Care Management (Signed)
[phone] 08-11-19 Case Manager received consult for medication assistance. Case Manager spoke with patient and she is without insurance at this time and without a primary care provider. Case Manager reached out to PA and he is sending prescriptions to the [facility]- MATCH completed and Rx's will be delivered to the patient's room prior to transition home. Case Manager made a hospital follow up appointment at the [facility] Department-patient will get medication assistance via the health department until she can receive Medicare in July. No further needs from Case Manager at this time. Gala Lewandowsky, RN,BSN Case Manager

## 2019-08-11 NOTE — Progress Notes (Signed)
CARDIAC REHAB PHASE I   PRE:  Rate/Rhythm: 92 SR    BP: sitting 113/64    SaO2: 95 RA  MODE:  Ambulation: 370 ft   POST:  Rate/Rhythm: 130 ST    BP: sitting 117/71     SaO2: 97 RA  Pt tolerated well although HR elevated with standing, 120s. Sts she was a little tired toward end of walk but otherwise felt well. BP stable. Ed completed with good understanding. Voiced reception of brilinta importance. Will refer to [facility]. Pt would like to follow up with cardiology in [place]. [phone]   Harriet Masson CES, ACSM 08/11/2019 11:39 AM

## 2019-08-11 NOTE — Progress Notes (Signed)
Per case manager patient does not have insurance so prescription sent to [facility] pharmacy today.  - Metoprolol tartrate 50 mg twice daily at home>> currently on Toprol-XL 25 mg.  Will give additional 50 mg today>> consolidate to Toprol-XL 100 mg starting tomorrow. -Was on losartan 100 mg at home>> here 12.5 mg daily (continue) - Continue spironolactone at current dose  Likely to unable to afford Entresto but can consider as outpatient. Can apply for assistant

## 2019-08-12 ENCOUNTER — Encounter (HOSPITAL_COMMUNITY): Payer: Self-pay | Admitting: Cardiovascular Disease

## 2019-08-12 DIAGNOSIS — E785 Hyperlipidemia, unspecified: Secondary | ICD-10-CM

## 2019-08-12 DIAGNOSIS — I255 Ischemic cardiomyopathy: Secondary | ICD-10-CM

## 2019-08-12 DIAGNOSIS — E782 Mixed hyperlipidemia: Secondary | ICD-10-CM

## 2019-08-12 DIAGNOSIS — I5021 Acute systolic (congestive) heart failure: Secondary | ICD-10-CM

## 2019-08-12 DIAGNOSIS — I2510 Atherosclerotic heart disease of native coronary artery without angina pectoris: Secondary | ICD-10-CM

## 2019-08-12 LAB — BASIC METABOLIC PANEL
Anion gap: 9 (ref 5–15)
BUN: 15 mg/dL (ref 8–23)
CO2: 27 mmol/L (ref 22–32)
Calcium: 8.8 mg/dL — ABNORMAL LOW (ref 8.9–10.3)
Chloride: 104 mmol/L (ref 98–111)
Creatinine, Ser: 1.18 mg/dL — ABNORMAL HIGH (ref 0.44–1.00)
GFR calc Af Amer: 56 mL/min — ABNORMAL LOW (ref >60)
GFR calc non Af Amer: 49 mL/min — ABNORMAL LOW (ref >60)
Glucose, Bld: 114 mg/dL — ABNORMAL HIGH (ref 70–99)
Potassium: 4.1 mmol/L (ref 3.5–5.1)
Sodium: 140 mmol/L (ref 135–145)

## 2019-08-12 LAB — CBC WITH DIFFERENTIAL/PLATELET
Abs Immature Granulocytes: 0.02 10*3/uL (ref 0.00–0.07)
Basophils Absolute: 0 10*3/uL (ref 0.0–0.1)
Basophils Relative: 0 %
Eosinophils Absolute: 0.1 10*3/uL (ref 0.0–0.5)
Eosinophils Relative: 1 %
HCT: 39.4 % (ref 36.0–46.0)
Hemoglobin: 13.3 g/dL (ref 12.0–15.0)
Immature Granulocytes: 0 %
Lymphocytes Relative: 19 %
Lymphs Abs: 1.7 10*3/uL (ref 0.7–4.0)
MCH: 31.4 pg (ref 26.0–34.0)
MCHC: 33.8 g/dL (ref 30.0–36.0)
MCV: 93.1 fL (ref 80.0–100.0)
Monocytes Absolute: 1 10*3/uL (ref 0.1–1.0)
Monocytes Relative: 11 %
Neutro Abs: 6.2 10*3/uL (ref 1.7–7.7)
Neutrophils Relative %: 69 %
Platelets: 269 10*3/uL (ref 150–400)
RBC: 4.23 MIL/uL (ref 3.87–5.11)
RDW: 12.2 % (ref 11.5–15.5)
WBC: 9 10*3/uL (ref 4.0–10.5)
nRBC: 0 % (ref 0.0–0.2)

## 2019-08-12 NOTE — TOC Transition Note (Addendum)
Transition of Care [facility]) - CM/SW Discharge Note   Patient Details  Name: Gloria Thompson MRN: [id number] Date of Birth: 1955/12/23  Transition of [facility]) CM/SW Contact:  Lockie Pares, RN Phone Number: 08/12/2019, 10:46 AM   Clinical Narrative:    Patient ready for discharge. TOC pharmacy  filled medications, via [facility]  retrieved from main pharmacy. And given to patient. Pharmacy sheet signed and returned to pharmacy via tube system.          Patient Goals and CMS Choice        Discharge Placement  Home                     Discharge Plan and Services    Home no services required                                 Social Determinants of Health (SDOH) Interventions     Readmission Risk Interventions No flowsheet data found.

## 2019-08-12 NOTE — Discharge Summary (Signed)
Discharge Summary    Patient ID: Gloria Thompson MRN: [id number]; DOB: April 03, 1956  Admit date: 08/09/2019 Discharge date: 08/12/2019  Primary Care Provider: Sharilyn Sites, MD  Primary Cardiologist: Shelva Majestic, MD  Primary Electrophysiologist:  None   Discharge Diagnoses    Principal Problem:   Acute ST elevation myocardial infarction (STEMI) due to occlusion of proximal portion of left anterior descending (LAD) coronary artery [facility]) Active Problems:   Acute systolic CHF (congestive heart failure) ([place])   CAD (coronary artery disease)   Ischemic cardiomyopathy   Hypertension   Hyperlipidemia    Diagnostic Studies/Procedures    Left Heart Catheterization 08/10/2019:  1st Mrg lesion is 60% stenosed.  Prox LAD to Mid LAD lesion is 100% stenosed.  Post intervention, there is a 0% residual stenosis.  A stent was successfully placed.  Acute ST segment elevation myocardial infarction secondary to total proximal occlusion of the LAD which arises from a common ostium or separate ostium from the circumflex vessel.  Dominant left circumflex vessel with 60% ostial narrowing of a very proximal marginal vessel. There is a high diagonal/ramus immediate-like vessel which arises more proximally.  Normal RCA with very small PDA.  LVEDP 25 mmHg  Very difficult but ultimately successful PCI to a totally occluded LAD with ultimate insertion of a 3.0 x 30 mm Resolute Onyx stent postdilated to 3.34 mm with the long occluded segment being reduced to 0% and resumption of brisk TIMI-3 flow in a large LAD system which wraps around the LV apex to supply the distal third of the inferior wall.  Recommendation: DAPT for minimum of 1 year. Aggressive lipid-lowering therapy with target LDL less than 70. Optimal blood pressure control. A 2D echo Doppler study will be done to assess LV function.  Diagnostic Dominance: Right  Intervention    _______________  Echocardiogram  08/10/2019: Impressions: 1. Left ventricular ejection fraction, by estimation, is 25%. The left  ventricle has severely decreased function. The left ventricle demonstrates  regional wall motion abnormalities. Akinesis of the mid to apical  anteroseptal, inferoseptal, and anterior  walls. Akinesis of the apical inferior and apical lateral walls. Akinesis  of the true apex. No LV thrombus visualized. There is mild left  ventricular hypertrophy. Left ventricular diastolic parameters are  consistent with Grade I diastolic dysfunction  (impaired relaxation).  2. Right ventricular systolic function is normal. The right ventricular  size is normal. Tricuspid regurgitation signal is inadequate for assessing  PA pressure.  3. The aortic valve is tricuspid. Aortic valve regurgitation is not  visualized. Mild aortic valve sclerosis is present, with no evidence of  aortic valve stenosis.  4. The inferior vena cava is normal in size with greater than 50%  respiratory variability, suggesting right atrial pressure of 3 mmHg.  5. The mitral valve is normal in structure. No evidence of mitral valve  regurgitation. No evidence of mitral stenosis.   History of Present Illness     Gloria Thompson is a 64 year old woman with a history of CAD with minimal proximal LAD on cath in 2006, hypertension, hyperlipidemia, and GERD who presented to the ED on 08/09/2019 with chest pain and dyspnea and was found to have anteroseptal ST elevation so code STEMI was activated.   Patient reported intermittent chest pain episodes for the last couple of months which became more consistent with an exertion on day of presentation. Pain would resolve with rest until 2 hours prior to presentation when pain returned and persisted. Pain radiates down  left arm and is associated with nausea, vomiting, and diaphoresis. Patient then developed dyspnea which ultimately prompted her to come to the ED. In addition, hands and feet have  been swelling lately. She has a very strong family history of CAD but is a non-smoker.    Presented to the [facility] and was hypertensive with ongoing chest pain. EKG showed ST elevation in septal leads with reciprocal changes and was accepted by Dr. Tresa Endo as a CODE STEMI. She was given ASA 324mg , Heparin 4000 units, Morphine, Zofran, and started on Nitro drip prior to transfer. On arrival to the cath lab, she was having mild ongoing chest pain with Nitro at 10 but was hemodynamically stable. SpO2 began gradually dropping as low as the upper 70s at the beginning of the case with sedation but she was in no distress and improved with additional oxygen.   Hospital Course     Consultants: None   Anterior STEMI  Patient presented with anterior STEMI as above. High-sensitivity troponin >27,000. Left heart catheterization showed 100% stenosis of the proximal to mid LAD and 60% stenosis of the 1st Marginal. There was difficulty advancing the balloon across the lesion but ultimately PCI was successful and DES was placed to LAD lesion. LVEDP 25 mmHg. Patient tolerated procedure well. Right radial cath site soft with no hematoma. Renal function stable. Able to ambulate without any problems.  - Started on dual antiplatelet therapy with Aspirin 81mg  daily and Brilinta 90mg  twice daily.  - Home Lopressor switched to Toprol-XL 100mg  daily during admission.  - Started on Lipitor 80mg  during admission.  Acute Systolic CHF/Ischemic Cardiomyopathy Echo showed LVEF of 25% with akinesis of the mid to apical anteroseptal, inferoseptal, and anterior walls as well as the apical inferior walls, apical lateral walls, and the true apex. No LV thrombus was visualized. LVEDP 25 mmHg on cath. BNP elevated at 1,023. Patient received one dose of IV Lasix 40mg  after the cath. Started on beta-blocker and ARB. - Continue Toprol-XL 100mg  daily and Losartan 12.5mg  daily at discharge.  - Initial plan was to start Spironolactone  today; however, with slight increase in creatinine and soft BPs at time, will hold off on this for now and can reconsider at follow-up visit. (Of note, patient has already received Spironolactone prescription from [facility] pharmacy in preparation for discharge over the weekend but have advised patient not to start taking this yet). - Will likely need repeat Echo in 3 months after being on good medical therapy.  Hypertension  BP has been mostly well controlled and actually soft at times.  - Continue Toprol-XL and Losartan as above. - Will need repeat BMET at follow-up visit.   Hyperlipidemia  Lipid panel this admission: Total Cholesterol 244, Triglycerides 154, HDL 52, LDL 161. LDL goal <70 given CAD.  - Started on Lipitor 80mg  daily this admission (Simvastatin was listed as home medication but patient states she was not taking this). - Will need repeat lipid panel and LFTs in 6-8 weeks.   Patient seen and examined by Dr. today and determined to be stable for discharge. Will arrange outpatient follow-up (TOC visit). Medications as below.   Did the patient have an acute coronary syndrome (MI, NSTEMI, STEMI, etc) this admission?:  Yes                               AHA/ACC Clinical Performance & Quality Measures: 1. Aspirin prescribed? - Yes 2.  ADP Receptor Inhibitor (Plavix/Clopidogrel, Brilinta/Ticagrelor or Effient/Prasugrel) prescribed (includes medically managed patients)? - Yes 3. Beta Blocker prescribed? - Yes 4. High Intensity Statin (Lipitor 40-80mg  or Crestor 20-40mg ) prescribed? - Yes 5. EF assessed during THIS hospitalization? - Yes 6. For EF <40%, was ACEI/ARB prescribed? - Yes 7. For EF <40%, Aldosterone Antagonist (Spironolactone or Eplerenone) prescribed? - No - Reason:  soft BP and renal insufficiency 8. Cardiac Rehab Phase II ordered (Included Medically managed Patients)? - Yes   _____________  Discharge Vitals Blood pressure 118/86, pulse 95, temperature 98.4 F (36.9  C), temperature source Oral, resp. rate 18, height 5\' 4"  (1.626 m), weight 85 kg, SpO2 96 %.  Filed Weights   08/10/19 0345 08/11/19 0635 08/12/19 0445  Weight: 87.4 kg 85.8 kg 85 kg    Labs & Radiologic Studies    CBC Recent Labs    08/09/19 2347 08/09/19 2347 08/10/19 0618 08/12/19 0419  WBC 11.5*   < > 12.4* 9.0  NEUTROABS 6.8  --   --  6.2  HGB 15.1*   < > 15.5* 13.3  HCT 44.8   < > 45.0 39.4  MCV 92.8   < > 91.6 93.1  PLT 352   < > 291 269   < > = values in this interval not displayed.   Basic Metabolic Panel Recent Labs    47/82/9504/22/21 0618 08/12/19 0419  NA 139 140  K 3.9 4.1  CL 104 104  CO2 21* 27  GLUCOSE 160* 114*  BUN 12 15  CREATININE 0.96 1.18*  CALCIUM 9.0 8.8*   Liver Function Tests Recent Labs    08/09/19 2347  AST 43*  ALT 37  ALKPHOS 127*  BILITOT 0.4  PROT 8.5*  ALBUMIN 4.4   No results for input(s): LIPASE, AMYLASE in the last 72 hours. High Sensitivity Troponin:   Recent Labs  Lab 08/09/19 2347 08/10/19 0618 08/10/19 0933 08/10/19 1222  TROPONINIHS 831* >27,000* >27,000* >27,000*    BNP Invalid input(s): POCBNP D-Dimer No results for input(s): DDIMER in the last 72 hours. Hemoglobin A1C Recent Labs    08/09/19 2347  HGBA1C 5.3   Fasting Lipid Panel Recent Labs    08/09/19 2347  CHOL 244*  HDL 52  LDLCALC 161*  TRIG 154*  CHOLHDL 4.7   Thyroid Function Tests No results for input(s): TSH, T4TOTAL, T3FREE, THYROIDAB in the last 72 hours.  Invalid input(s): FREET3 _____________  CARDIAC CATHETERIZATION  Result Date: 08/10/2019  1st Mrg lesion is 60% stenosed.  Prox LAD to Mid LAD lesion is 100% stenosed.  Post intervention, there is a 0% residual stenosis.  A stent was successfully placed.  Acute ST segment elevation myocardial infarction secondary to total proximal occlusion of the LAD which arises from a common ostium or separate ostium from the circumflex vessel. Dominant left circumflex vessel with 60% ostial  narrowing of a very proximal marginal vessel.  There is a high diagonal/ramus immediate-like vessel which arises more proximally. Normal RCA with very small PDA. LVEDP 25 mmHg Very difficult but ultimately successful PCI to a totally occluded LAD with ultimate insertion of a 3.0 x 30 mm Resolute Onyx stent postdilated to 3.34 mm with the long occluded segment being reduced to 0% and resumption of brisk TIMI-3 flow in a large LAD system which wraps around the LV apex to supply the distal third of the inferior wall. RECOMMENDATION: DAPT for minimum of 1 year.  Aggressive lipid-lowering therapy with target LDL less than 70.  Optimal  blood pressure control.  A 2D echo Doppler study will be done to assess LV function.   DG Chest Port 1 View  Result Date: 08/10/2019 CLINICAL DATA:  Chest pain, shortness of breath, cough EXAM: PORTABLE CHEST 1 VIEW COMPARISON:  06/22/2005 FINDINGS: Heart is normal size. No confluent opacities, effusions or edema. No acute bony abnormality. IMPRESSION: No active disease. Electronically Signed   By: Charlett Nose M.D.   On: 08/10/2019 00:07   ECHOCARDIOGRAM COMPLETE  Result Date: 08/10/2019    ECHOCARDIOGRAM REPORT   Patient Name:   Gloria Thompson Date of Exam: 08/10/2019 Medical Rec #:  [id number]         Height:       64.0 in Accession #:    [id number]        Weight:       192.7 lb Date of Birth:  10-Jan-1956         BSA:          1.926 m Patient Age:    63 years          BP:           122/80 mmHg Patient Gender: F                 HR:           87 bpm. Exam Location:  Inpatient Procedure: 2D Echo, Color Doppler, Cardiac Doppler and Intracardiac            Opacification Agent Indications:    Acute MI 410  History:        Patient has no prior history of Echocardiogram examinations.                 STEMI; Risk Factors:Hypertension.  Sonographer:    Irving Burton Senior RDCS Referring Phys: [phone] THOMAS A KELLY  Sonographer Comments: Technically difficult study due to poor echo windows, unable  to turn due to post-cath restrictions. IMPRESSIONS  1. Left ventricular ejection fraction, by estimation, is 25%. The left ventricle has severely decreased function. The left ventricle demonstrates regional wall motion abnormalities. Akinesis of the mid to apical anteroseptal, inferoseptal, and anterior walls. Akinesis of the apical inferior and apical lateral walls. Akinesis of the true apex. No LV thrombus visualized. There is mild left ventricular hypertrophy. Left ventricular diastolic parameters are consistent with Grade I diastolic dysfunction (impaired relaxation).  2. Right ventricular systolic function is normal. The right ventricular size is normal. Tricuspid regurgitation signal is inadequate for assessing PA pressure.  3. The aortic valve is tricuspid. Aortic valve regurgitation is not visualized. Mild aortic valve sclerosis is present, with no evidence of aortic valve stenosis.  4. The inferior vena cava is normal in size with greater than 50% respiratory variability, suggesting right atrial pressure of 3 mmHg.  5. The mitral valve is normal in structure. No evidence of mitral valve regurgitation. No evidence of mitral stenosis. FINDINGS  Left Ventricle: Left ventricular ejection fraction, by estimation, is 25%. The left ventricle has severely decreased function. The left ventricle demonstrates regional wall motion abnormalities. Definity contrast agent was given IV to delineate the left  ventricular endocardial borders. The left ventricular internal cavity size was normal in size. There is mild left ventricular hypertrophy. Left ventricular diastolic parameters are consistent with Grade I diastolic dysfunction (impaired relaxation). Right Ventricle: The right ventricular size is normal. No increase in right ventricular wall thickness. Right ventricular systolic function is normal. Tricuspid regurgitation signal is inadequate for assessing PA  pressure. Left Atrium: Left atrial size was normal in size.  Right Atrium: Right atrial size was normal in size. Pericardium: There is no evidence of pericardial effusion. Mitral Valve: The mitral valve is normal in structure. There is mild calcification of the mitral valve leaflet(s). No evidence of mitral valve regurgitation. No evidence of mitral valve stenosis. Tricuspid Valve: The tricuspid valve is normal in structure. Tricuspid valve regurgitation is not demonstrated. Aortic Valve: The aortic valve is tricuspid. Aortic valve regurgitation is not visualized. Mild aortic valve sclerosis is present, with no evidence of aortic valve stenosis. Pulmonic Valve: The pulmonic valve was normal in structure. Pulmonic valve regurgitation is trivial. Aorta: The aortic root is normal in size and structure. Venous: The inferior vena cava is normal in size with greater than 50% respiratory variability, suggesting right atrial pressure of 3 mmHg. IAS/Shunts: No atrial level shunt detected by color flow Doppler.  LEFT VENTRICLE PLAX 2D LVIDd:         3.60 cm  Diastology LVIDs:         2.80 cm  LV e' lateral:   2.61 cm/s LV PW:         0.70 cm  LV E/e' lateral: 20.3 LV IVS:        1.40 cm  LV e' medial:    4.24 cm/s LVOT diam:     1.70 cm  LV E/e' medial:  12.5 LV SV:         45 LV SV Index:   23 LVOT Area:     2.27 cm  RIGHT VENTRICLE RV S prime:     6.42 cm/s TAPSE (M-mode): 1.1 cm LEFT ATRIUM             Index LA diam:        2.40 cm 1.25 cm/m LA Vol (A2C):   41.8 ml 21.71 ml/m LA Vol (A4C):   40.5 ml 21.03 ml/m LA Biplane Vol: 44.7 ml 23.21 ml/m  AORTIC VALVE LVOT Vmax:   102.00 cm/s LVOT Vmean:  77.700 cm/s LVOT VTI:    0.197 m  AORTA Ao Root diam: 3.40 cm Ao Asc diam:  3.20 cm MITRAL VALVE MV Area (PHT): 3.54 cm     SHUNTS MV Decel Time: 214 msec     Systemic VTI:  0.20 m MV E velocity: 53.10 cm/s   Systemic Diam: 1.70 cm MV A velocity: 103.00 cm/s MV E/A ratio:  0.52 Marca Ancona MD Electronically signed by Marca Ancona MD Signature Date/Time: 08/10/2019/2:17:59 PM    Final     Disposition   Patient is being discharged home today in good condition.  Follow-up Plans & Appointments    Follow-up Information    [facility] Follow up on 09/04/2019.   Why: @ 1:00 pm for hospital follow up with Kizzie Furnish, Please bring proof of household income and discharge summary to appointment. If you cannot make this scheduled appointment please call to reschedule.  Contact information: [address] [place] [address] [phone]        Lennette Bihari, MD Follow up.   Specialty: Cardiology Why: Our office will call you early next week to schedule a follow-up visit. However, if you do not hear from [place] by Tuesday 08/15/2019, please call our office. Contact information: [address] [place] [address] [phone]          Discharge Instructions    Amb Referral to Cardiac Rehabilitation   Complete by: As directed  Diagnosis:  Coronary Stents PTCA STEMI     After initial evaluation and assessments completed: Virtual Based Care may be provided alone or in conjunction with Phase 2 Cardiac Rehab based on patient barriers.: Yes      Discharge Medications   Allergies as of 08/12/2019      Reactions   Other    States allergies to multiple antibiotics but NAMES UNKNOWN.   Codeine Palpitations      Medication List    STOP taking these medications   aspirin 325 MG tablet Replaced by: aspirin EC 81 MG tablet   metoprolol tartrate 50 MG tablet Commonly known as: LOPRESSOR   simvastatin 20 MG tablet Commonly known as: ZOCOR     TAKE these medications   aspirin EC 81 MG tablet Take 1 tablet (81 mg total) by mouth daily. Replaces: aspirin 325 MG tablet   atorvastatin 80 MG tablet Commonly known as: LIPITOR Take 1 tablet (80 mg total) by mouth daily.   esomeprazole 20 MG capsule Commonly known as: NEXIUM Take 20 mg by mouth daily before breakfast.   losartan 25 MG tablet Commonly known as: COZAAR Take  0.5 tablets (12.5 mg total) by mouth daily. What changed:   medication strength  how much to take   metoprolol succinate 100 MG 24 hr tablet Commonly known as: TOPROL-XL Take 1 tablet (100 mg total) by mouth daily. Take with or immediately following a meal.   nitroGLYCERIN 0.4 MG SL tablet Commonly known as: Nitrostat Place 1 tablet (0.4 mg total) under the tongue every 5 (five) minutes as needed.   spironolactone 25 MG tablet Commonly known as: ALDACTONE Take 0.5 tablets (12.5 mg total) by mouth daily.   ticagrelor 90 MG Tabs tablet Commonly known as: BRILINTA Take 1 tablet (90 mg total) by mouth 2 (two) times daily.          Outstanding Labs/Studies   Repeat BMET at follow-up visit.  Repeat lipid panel and LFTs in 6-8 weeks.   Duration of Discharge Encounter   Greater than 30 minutes including physician time.  Signed, Corrin Parker, PA-C 08/12/2019, 9:48 AM

## 2019-08-12 NOTE — Progress Notes (Signed)
CARDIAC REHAB PHASE I   PRE:  Rate/Rhythm: 97 SR  BP:  Supine: 117/70  Sitting:   Standing:    SaO2: 94%RA  MODE:  Ambulation: 470 ft   POST:  Rate/Rhythm: 125 ST  104 with rest  BP:  Supine:   Sitting: 133/74  Standing:    SaO2: 97%RA 0940-1002 Pt walked 470 ft on RA with steady gait. Did not need to rest. No CP. HR elevated but pt denied palpitations. HR lower with rest.   Luetta Nutting, RN BSN  08/12/2019 9:59 AM

## 2019-08-12 NOTE — Progress Notes (Addendum)
Progress Note  Patient Name: Gloria Thompson Date of Encounter: 08/12/2019  Primary Cardiologist: [facility] (Dr. Claiborne Billings)  Subjective   No acute overnight events. Patient denies any chest pain, shortness of breath, or palpitations. She did have a bad headache overnight but states this has improved some. She does have a history of headaches.  Inpatient Medications    Scheduled Meds: . aspirin  81 mg Oral Daily  . atorvastatin  80 mg Oral Daily  . Chlorhexidine Gluconate Cloth  6 each Topical Daily  . enoxaparin (LOVENOX) injection  40 mg Subcutaneous Daily  . losartan  12.5 mg Oral Daily  . metoprolol succinate  100 mg Oral Daily  . sodium chloride flush  3 mL Intravenous Q12H  . spironolactone  12.5 mg Oral Daily  . ticagrelor  90 mg Oral BID   Continuous Infusions: . sodium chloride Stopped (08/10/19 0416)  . sodium chloride     PRN Meds: sodium chloride, acetaminophen, alum & mag hydroxide-simeth, diazepam, ondansetron (ZOFRAN) IV, sodium chloride flush   Vital Signs    Vitals:   08/11/19 1506 08/11/19 2005 08/12/19 0300 08/12/19 0445  BP: 122/75 112/64  118/86  Pulse: (!) 109 88  94  Resp: 20 20  20   Temp: 98.4 F (36.9 C) 97.9 F (36.6 C)  98.4 F (36.9 C)  TempSrc: Oral Oral  Oral  SpO2: 96% 96% 96% 96%  Weight:    85 kg  Height:        Intake/Output Summary (Last 24 hours) at 08/12/2019 0723 Last data filed at 08/11/2019 2154 Gross per 24 hour  Intake 243 ml  Output 600 ml  Net -357 ml   Last 3 Weights 08/12/2019 08/11/2019 08/10/2019  Weight (lbs) 187 lb 6.3 oz 189 lb 2.5 oz 192 lb 10.9 oz  Weight (kg) 85 kg 85.8 kg 87.4 kg      Telemetry    Sinus rhythm with rates in the 80's to low 100's. - Personally Reviewed  ECG    Normal sinus rhythm, rate 96 bpm, with underlying artifact and non-specific ST/T changes. ST elevation has resolved. QT/QTc automatically read as 414/523 ms but looks within normal limits on my read.  - Personally Reviewed  Physical  Exam   GEN: Obese Caucasian female resting comfortably in no acute distress.   Neck: Supple.  Cardiac: RRR. No murmurs, rubs, or gallops. Right radial cath site soft with no signs of hematoma. Radial pulses 2+ and equal bilaterally. Respiratory: Clear to auscultation bilaterally. No wheezes, rhonchi, or rales.  GI: Soft, non-distended, and non-tender. Bowel sounds present.  MS: No edema. No deformity. Skin: Warm and dry. Neuro:  No focal deficits. Psych: Normal affect. Responds appropriately.  Labs    High Sensitivity Troponin:   Recent Labs  Lab 08/09/19 2347 08/10/19 0618 08/10/19 0933 08/10/19 1222  TROPONINIHS 831* >27,000* >27,000* >27,000*      Chemistry Recent Labs  Lab 08/09/19 2347 08/10/19 0618 08/12/19 0419  NA 135 139 140  K 3.3* 3.9 4.1  CL 101 104 104  CO2 21* 21* 27  GLUCOSE 161* 160* 114*  BUN 17 12 15   CREATININE 1.04* 0.96 1.18*  CALCIUM 9.0 9.0 8.8*  PROT 8.5*  --   --   ALBUMIN 4.4  --   --   AST 43*  --   --   ALT 37  --   --   ALKPHOS 127*  --   --   BILITOT 0.4  --   --  GFRNONAA 57* >60 49*  GFRAA >60 >60 56*  ANIONGAP 13 14 9      Hematology Recent Labs  Lab 08/09/19 2347 08/10/19 0618 08/12/19 0419  WBC 11.5* 12.4* 9.0  RBC 4.83 4.91 4.23  HGB 15.1* 15.5* 13.3  HCT 44.8 45.0 39.4  MCV 92.8 91.6 93.1  MCH 31.3 31.6 31.4  MCHC 33.7 34.4 33.8  RDW 11.9 11.9 12.2  PLT 352 291 269    BNP Recent Labs  Lab 08/09/19 2357 08/11/19 1433  BNP 141.0* 1,023.0*     DDimer No results for input(s): DDIMER in the last 168 hours.   Radiology    ECHOCARDIOGRAM COMPLETE  Result Date: 08/10/2019    ECHOCARDIOGRAM REPORT   Patient Name:   Gloria Thompson Date of Exam: 08/10/2019 Medical Rec #:  08/12/2019         Height:       64.0 in Accession #:    [id number]        Weight:       192.7 lb Date of Birth:  January 25, 1956         BSA:          1.926 m Patient Age:    63 years          BP:           122/80 mmHg Patient Gender: F                  HR:           87 bpm. Exam Location:  Inpatient Procedure: 2D Echo, Color Doppler, Cardiac Doppler and Intracardiac            Opacification Agent Indications:    Acute MI 410  History:        Patient has no prior history of Echocardiogram examinations.                 STEMI; Risk Factors:Hypertension.  Sonographer:    11/05/1955 Senior RDCS Referring Phys: [phone] THOMAS A KELLY  Sonographer Comments: Technically difficult study due to poor echo windows, unable to turn due to post-cath restrictions. IMPRESSIONS  1. Left ventricular ejection fraction, by estimation, is 25%. The left ventricle has severely decreased function. The left ventricle demonstrates regional wall motion abnormalities. Akinesis of the mid to apical anteroseptal, inferoseptal, and anterior walls. Akinesis of the apical inferior and apical lateral walls. Akinesis of the true apex. No LV thrombus visualized. There is mild left ventricular hypertrophy. Left ventricular diastolic parameters are consistent with Grade I diastolic dysfunction (impaired relaxation).  2. Right ventricular systolic function is normal. The right ventricular size is normal. Tricuspid regurgitation signal is inadequate for assessing PA pressure.  3. The aortic valve is tricuspid. Aortic valve regurgitation is not visualized. Mild aortic valve sclerosis is present, with no evidence of aortic valve stenosis.  4. The inferior vena cava is normal in size with greater than 50% respiratory variability, suggesting right atrial pressure of 3 mmHg.  5. The mitral valve is normal in structure. No evidence of mitral valve regurgitation. No evidence of mitral stenosis. FINDINGS  Left Ventricle: Left ventricular ejection fraction, by estimation, is 25%. The left ventricle has severely decreased function. The left ventricle demonstrates regional wall motion abnormalities. Definity contrast agent was given IV to delineate the left  ventricular endocardial borders. The left ventricular  internal cavity size was normal in size. There is mild left ventricular hypertrophy. Left ventricular diastolic parameters are consistent with Grade I  diastolic dysfunction (impaired relaxation). Right Ventricle: The right ventricular size is normal. No increase in right ventricular wall thickness. Right ventricular systolic function is normal. Tricuspid regurgitation signal is inadequate for assessing PA pressure. Left Atrium: Left atrial size was normal in size. Right Atrium: Right atrial size was normal in size. Pericardium: There is no evidence of pericardial effusion. Mitral Valve: The mitral valve is normal in structure. There is mild calcification of the mitral valve leaflet(s). No evidence of mitral valve regurgitation. No evidence of mitral valve stenosis. Tricuspid Valve: The tricuspid valve is normal in structure. Tricuspid valve regurgitation is not demonstrated. Aortic Valve: The aortic valve is tricuspid. Aortic valve regurgitation is not visualized. Mild aortic valve sclerosis is present, with no evidence of aortic valve stenosis. Pulmonic Valve: The pulmonic valve was normal in structure. Pulmonic valve regurgitation is trivial. Aorta: The aortic root is normal in size and structure. Venous: The inferior vena cava is normal in size with greater than 50% respiratory variability, suggesting right atrial pressure of 3 mmHg. IAS/Shunts: No atrial level shunt detected by color flow Doppler.  LEFT VENTRICLE PLAX 2D LVIDd:         3.60 cm  Diastology LVIDs:         2.80 cm  LV e' lateral:   2.61 cm/s LV PW:         0.70 cm  LV E/e' lateral: 20.3 LV IVS:        1.40 cm  LV e' medial:    4.24 cm/s LVOT diam:     1.70 cm  LV E/e' medial:  12.5 LV SV:         45 LV SV Index:   23 LVOT Area:     2.27 cm  RIGHT VENTRICLE RV S prime:     6.42 cm/s TAPSE (M-mode): 1.1 cm LEFT ATRIUM             Index LA diam:        2.40 cm 1.25 cm/m LA Vol (A2C):   41.8 ml 21.71 ml/m LA Vol (A4C):   40.5 ml 21.03 ml/m LA  Biplane Vol: 44.7 ml 23.21 ml/m  AORTIC VALVE LVOT Vmax:   102.00 cm/s LVOT Vmean:  77.700 cm/s LVOT VTI:    0.197 m  AORTA Ao Root diam: 3.40 cm Ao Asc diam:  3.20 cm MITRAL VALVE MV Area (PHT): 3.54 cm     SHUNTS MV Decel Time: 214 msec     Systemic VTI:  0.20 m MV E velocity: 53.10 cm/s   Systemic Diam: 1.70 cm MV A velocity: 103.00 cm/s MV E/A ratio:  0.52 Marca Ancona MD Electronically signed by Marca Ancona MD Signature Date/Time: 08/10/2019/2:17:59 PM    Final     Cardiac Studies   Left Heart Catheterization 08/10/2019:  1st Mrg lesion is 60% stenosed.  Prox LAD to Mid LAD lesion is 100% stenosed.  Post intervention, there is a 0% residual stenosis.  A stent was successfully placed.   Acute ST segment elevation myocardial infarction secondary to total proximal occlusion of the LAD which arises from a common ostium or separate ostium from the circumflex vessel.  Dominant left circumflex vessel with 60% ostial narrowing of a very proximal marginal vessel.  There is a high diagonal/ramus immediate-like vessel which arises more proximally.  Normal RCA with very small PDA.  LVEDP 25 mmHg  Very difficult but ultimately successful PCI to a totally occluded LAD with ultimate insertion of a 3.0 x 30 mm  Resolute Onyx stent postdilated to 3.34 mm with the long occluded segment being reduced to 0% and resumption of brisk TIMI-3 flow in a large LAD system which wraps around the LV apex to supply the distal third of the inferior wall.  Recommendation: DAPT for minimum of 1 year.  Aggressive lipid-lowering therapy with target LDL less than 70.  Optimal blood pressure control.  A 2D echo Doppler study will be done to assess LV function. _______________  Echocardiogram 08/10/2019: Impressions: 1. Left ventricular ejection fraction, by estimation, is 25%. The left  ventricle has severely decreased function. The left ventricle demonstrates  regional wall motion abnormalities. Akinesis of  the mid to apical  anteroseptal, inferoseptal, and anterior  walls. Akinesis of the apical inferior and apical lateral walls. Akinesis  of the true apex. No LV thrombus visualized. There is mild left  ventricular hypertrophy. Left ventricular diastolic parameters are  consistent with Grade I diastolic dysfunction  (impaired relaxation).  2. Right ventricular systolic function is normal. The right ventricular  size is normal. Tricuspid regurgitation signal is inadequate for assessing  PA pressure.  3. The aortic valve is tricuspid. Aortic valve regurgitation is not  visualized. Mild aortic valve sclerosis is present, with no evidence of  aortic valve stenosis.  4. The inferior vena cava is normal in size with greater than 50%  respiratory variability, suggesting right atrial pressure of 3 mmHg.  5. The mitral valve is normal in structure. No evidence of mitral valve  regurgitation. No evidence of mitral stenosis.   Patient Profile     64 y.o. female with a history CAD with minimal proximal LAD on cath in 2006, hypertension, hyperlipidemia, and GERD who presented to the ED on 08/09/2019 with chest pain and dyspnea and was found to have anteroseptal ST elevation so code STEMI was activated.  Assessment & Plan    Acute Anterior STEMI - High-sensitivity troponin >27,000.  - LHC showed 100% stenosis of the proximal to mid LAD and 60% stenosis of the 1st Marginal. There was difficulty advancing the balloon across the lesion but ultimately PCI was successful and DES was placed to LAD lesion. LVEDP 25 mmHg.  - Started on dual antiplatelet therapy with Aspirin 81mg  daily and Brilinta 90mg  twice daily.  - Home Lopressor switched to Toprol-XL and dose has been uptitrated. Currently on 100mg  daily with first dose of this higher dose scheduled for this morning.  - Home Simvastatin switched to Lipitor 80mg  during admission.   Acute Systolic CHF - Echo showed LVEF of 25% with akinesis of the mid  to apical anteroseptal, inferoseptal, and anterior walls as well as the apical inferior walls, apical lateral walls, and the true apex. No LV thrombus was visualized.  - LVEDP 25 mmHg on cath.  - BNP elevated at 1,023.  - Patient received one dose of IV Lasix 40mg  after the cath. Documented urinary output of 2.4 L over the last 24 hours. Net negative 816 mL. Creatinine slightly up from yesterday.  - Continue Toprol-XL 100mg  daily. May need to further titrate at outpatient visit.  - Continue Losartan 12.5mg  daily. - Scheduled to start Spironolactone 12.5mg  daily today but given slight rise in creatinine will discuss with MD about whether we should wait to start this as outpatient.   Hypertension - BP well controlled (soft at times). BP 118/86 this morning. - Continue Toprol and Losartan as above.  - Will discuss Spironolactone with MD.   Hyperlipidemia - Lipid panel this admission: Total Cholesterol 244,  Triglycerides 154, HDL 52, LDL 161. - LDL goal <70 given CAD.  - Patient on Simvastatin at home but switched to Lipitor 80mg  daily here.  - Will need repeat lipid panel and LFTs in 6-8 weeks.    For questions or updates, please contact [facility] Please consult [email or web address] for contact info under        Signed, Corrin ParkerCallie E Goodrich, PA-C  08/12/2019, 7:23 AM    Patient seen and examined with Marjie Skiffallie Goodrich PA-C.  Agree as above, with the following exceptions and changes as noted below.   This is a 64 year old female with a history of CAD on prior cath, hypertension, hyperlipidemia, GERD who presented to the emergency department on 08/09/2019 with chest pain and dyspnea found to have anteroseptal ST elevations.  Emergent cardiac catheterization demonstrated total proximal occlusion of the LAD from proximal to mid vessel and a left dominant system.  Challenging but ultimately successful PCI to the totally occluded LAD with DES and subsequent brisk TIMI-3 flow.  Recommendation for dual  antiplatelet therapy for 1 year and aggressive medical management for secondary prevention of CAD.   The patient is feeling well overall, right radial cath site is stable with no pain or hematoma.  She has had no chest pain and no shortness of breath.  She has not yet walked with the nurse this morning but is otherwise feeling well without dizziness despite a slightly soft blood pressure Gen: NAD, CV: RRR, no murmurs, Lungs: clear, Abd: soft, Extrem: Warm, well perfused, trace edema, Neuro/Psych: alert and oriented x 3, normal mood and affect. All available labs, radiology testing, previous records reviewed.  Would recommend holding spironolactone initiation until outpatient evaluation.  Home-going medical regimen includes: Aspirin 81 mg daily, Brilinta 90 mg twice daily, Toprol-XL 100 mg daily, Lipitor 80 mg daily (transitioned from simvastatin on presentation), losartan 12.5 mg daily.  Consider starting spironolactone as an outpatient due to soft blood pressure in hospital.  She will need lipid panel and LFTs in 6 to 8 weeks for transition to high intensity statin therapy.  She is looking forward to getting back to her usual activities of cooking and keeping up her house.  After walking with the nurse, she will likely be stable for discharge.  Parke PoissonGayatri A Eleena Grater, MD 08/12/19 9:28 AM

## 2019-08-12 NOTE — Discharge Instructions (Signed)
Heart Attack A heart attack occurs when blood and oxygen supply to the heart is cut off. A heart attack causes damage to the heart that cannot be fixed. A heart attack is also called a myocardial infarction, or MI. If you think you are having a heart attack, do not wait to see if the symptoms will go away. Get medical help right away. What are the causes? This condition may be caused by:  A fatty substance (plaque) in the blood vessels (arteries). This can block the flow of blood to the heart.  A blood clot in the blood vessels that go to the heart. The blood clot blocks blood flow.  Low blood pressure.  An abnormal heartbeat.  Some diseases, such as problems in red blood cells (anemia)orproblems in breathing (respiratory failure).  Tightening (spasm) of a blood vessel that cuts off blood to the heart.  A tear in a blood vessel of the heart.  High blood pressure. What increases the risk? The following factors may make you more likely to develop this condition:  Aging. The older you are, the higher your risk.  Having a personal or family history of chest pain, heart attack, stroke, or narrowing of the arteries in the legs, arms, head, or stomach (peripheral artery disease).  Being female.  Smoking.  Not getting regular exercise.  Being overweight or obese.  Having high blood pressure.  Having high cholesterol.  Having diabetes.  Drinking too much alcohol.  Using illegal drugs, such as cocaine or methamphetamine. What are the signs or symptoms? Symptoms of this condition include:  Chest pain. It may feel like: ? Crushing or squeezing. ? Tightness, pressure, fullness, or heaviness.  Pain in the arm, neck, jaw, back, or upper body.  Shortness of breath.  Heartburn.  Upset stomach (indigestion).  Feeling like you may vomit (nauseous).  Cold sweats.  Feeling tired.  Sudden light-headedness. How is this treated? A heart attack must be treated as soon as  possible. Treatment may include:  Medicines to: ? Break up or dissolve blood clots. ? Thin blood and help prevent blood clots. ? Treat blood pressure. ? Improve blood flow to the heart. ? Reduce pain. ? Reduce cholesterol.  Procedures to widen a blocked artery and keep it open.  Open heart surgery.  Receiving oxygen.  Making your heart strong again (cardiac rehabilitation) through exercise, education, and counseling. Follow these instructions at home: Medicines  Take over-the-counter and prescription medicines only as told by your doctor. You may need to take medicine: ? To keep your blood from clotting too easily. ? To control blood pressure. ? To lower cholesterol. ? To control heart rhythms.  Do not take these medicines unless your doctor says it is okay: ? NSAIDs, such as ibuprofen. ? Supplements that have vitamin A, vitamin E, or both. ? Hormone replacement therapy that has estrogen with or without progestin. Lifestyle      Do not use any products that have nicotine or tobacco, such as cigarettes, e-cigarettes, and chewing tobacco. If you need help quitting, ask your doctor.  Avoid secondhand smoke.  Exercise regularly. Ask your doctor about a cardiac rehab program.  Eat heart-healthy foods. Your doctor will tell you what foods to eat.  Stay at a healthy weight.  Lower your stress level.  Do not use illegal drugs. Alcohol use  Do not drink alcohol if: ? Your doctor tells you not to drink. ? You are pregnant, may be pregnant, or are planning to become  pregnant.  If you drink alcohol: ? Limit how much you use to:  0-1 drink a day for women.  0-2 drinks a day for men. ? Know how much alcohol is in your drink. In the [place] one drink equals one 12 oz bottle of beer (355 mL), one 5 oz glass of wine (148 mL), or one 1 oz glass of hard liquor (44 mL). General instructions  Work with your doctor to treat other problems you may have, such as diabetes or high  blood pressure.  Get screened for depression. Get treatment if needed.  Keep your vaccines up to date. Get the flu shot (influenza vaccine) every year.  Keep all follow-up visits as told by your doctor. This is important. Contact a doctor if:  You feel very sad.  You have trouble doing your daily activities. Get help right away if:  You have sudden, unexplained discomfort in your chest, arms, back, neck, jaw, or upper body.  You have shortness of breath.  You have sudden sweating or clammy skin.  You feel like you may vomit.  You vomit.  You feel tired or weak.  You get light-headed or dizzy.  You feel your heart beating fast.  You feel your heart skipping beats.  You have blood pressure that is higher than 180/120. These symptoms may be an emergency. Do not wait to see if the symptoms will go away. Get medical help right away. Call your local emergency services (911 in the [place] Do not drive yourself to the hospital. Summary  A heart attack occurs when blood and oxygen supply to the heart is cut off.  Do not take NSAIDs unless your doctor says it is okay.  Do not smoke. Avoid secondhand smoke.  Exercise regularly. Ask your doctor about a cardiac rehab program. This information is not intended to replace advice given to you by your health care provider. Make sure you discuss any questions you have with your health care provider. Document Revised: 07/18/2018 Document Reviewed: 07/18/2018 Elsevier Patient Education  2020 [employer] Medications: - START Aspirin 81mg  daily and Brilinta 90mg  twice daily. These medications are very important and help keep the new stent in your heart open. - STOP your home Metoprolol Tartrate (Lopressor) and START Metoprolol Succinate (Toprol-XL) 100mg  daily. - START Losartan 12.5mg  daily.  - START Atorvastatin 80mg  daily for your cholesterol.  - You have been given Spironolactone but please do NOT start taking this yet. We will  likely have you start this at your follow-up visit but we want to see you in our office before you start this.   Post STEMI: NO HEAVY LIFTING X 4 WEEKS. NO SEXUAL ACTIVITY X 4 WEEKS. NO DRIVING X 2 WEEKS. NO SOAKING BATHS, HOT TUBS, POOLS, ETC., X 7 DAYS.  Radial Site Care: Refer to this sheet in the next few weeks. These instructions provide you with information on caring for yourself after your procedure. Your caregiver may also give you more specific instructions. Your treatment has been planned according to current medical practices, but problems sometimes occur. Call your caregiver if you have any problems or questions after your procedure. HOME CARE INSTRUCTIONS  You may shower the day after the procedure.Remove the bandage (dressing) and gently wash the site with plain soap and water.Gently pat the site dry.   Do not apply powder or lotion to the site.   Do not submerge the affected site in water for 3 to 5 days.   Inspect the site at  least twice daily.   Do not flex or bend the affected arm for 24 hours.   No lifting over 5 pounds (2.3 kg) for 5 days after your procedure.   Do not drive home if you are discharged the same day of the procedure. Have someone else drive you.  What to expect:  Any bruising will usually fade within 1 to 2 weeks.   Blood that collects in the tissue (hematoma) may be painful to the touch. It should usually decrease in size and tenderness within 1 to 2 weeks.  SEEK IMMEDIATE MEDICAL CARE IF:  You have unusual pain at the radial site.   You have redness, warmth, swelling, or pain at the radial site.   You have drainage (other than a small amount of blood on the dressing).   You have chills.   You have a fever or persistent symptoms for more than 72 hours.   You have a fever and your symptoms suddenly get worse.   Your arm becomes pale, cool, tingly, or numb.   You have heavy bleeding from the site. Hold pressure on the site.

## 2019-08-14 ENCOUNTER — Telehealth: Payer: Self-pay | Admitting: Student

## 2019-08-14 ENCOUNTER — Telehealth: Payer: Self-pay | Admitting: Cardiovascular Disease

## 2019-08-14 NOTE — Telephone Encounter (Signed)
Sent to [facility] scheduling to arrange TOC f/u

## 2019-08-14 NOTE — Telephone Encounter (Signed)
-----   Message from Corrin Parker, [place] sent at 08/12/2019  9:43 AM EDT ----- Regarding: TOC Follow-UP Patient being discharge today following STEMI. She needs a TOC follow-up with Dr. Tresa Endo or one of the APPs on his team in 7-10 days. Can you please call the patient and help arrange this?  Thank you! Callie

## 2019-08-14 NOTE — Telephone Encounter (Signed)
New message   Per Josefina Do. Scheduled a TOC appt on 08/24/2019 at 10:45 am  with Joni Reining.

## 2019-08-14 NOTE — Telephone Encounter (Signed)
If it's only causing symptoms with evening dose, it's not the Brilinta.  Burning in the throat can sometimes occur if the tablets get stuck in the throat, so have her be sure to take tablets with plenty of water.  Caffeine only helps if they are experiencing shortness of breath.

## 2019-08-14 NOTE — Telephone Encounter (Signed)
The patient has been called and verbalized her understanding. She will call back if she does not get better.

## 2019-08-14 NOTE — Telephone Encounter (Signed)
Patient contacted regarding discharge from [facility] on 08/12/19.  Patient understands to follow up with provider Lorin Picket, DNP on 08/24/19 at 10:45 at [facility] office. Patient understands discharge instructions? yes Patient understands medications and regiment? yes Patient understands to bring all medications to this visit? yes  The patient stated that she feels like the Brilinta is causing nausea, burning in the throat and headache. She stated that the morning is fine but the night dose is causing her the symptoms. She has tried taking the Brilinta with food and caffeine and it has not helped.

## 2019-08-15 NOTE — Telephone Encounter (Signed)
Patient has already been contacted for [facility] outreach

## 2019-08-20 ENCOUNTER — Telehealth: Payer: Self-pay | Admitting: Student

## 2019-08-20 MED ORDER — ATORVASTATIN CALCIUM 80 MG PO TABS: 40.0000 mg | ORAL_TABLET | Freq: Every day | ORAL | 6 refills | Status: DC

## 2019-08-20 NOTE — Telephone Encounter (Signed)
    The patient called the after-hours line asking a variety of questions about her medications. She was curious what they were prescribed for and why she is on her current regimen. Her biggest concern is that she has experienced worsening muscle aches over the past few days that are specifically most notable along her legs. She reports a history of statin intolerances and was switched from Simvastatin to Atorvastatin during her recent admission given LDL was elevated to 161. She is currently taking Atorvastatin 80 mg daily and I recommended she reduce this to 40 mg daily for now. She does have a follow-up visit this coming week. If myalgias do not improve, would consider switching to Crestor.   She voiced understanding of the above information and was appreciative of the return call.  Signed, Ellsworth Lennox, PA-C 08/20/2019, 11:59 AM Pager: [phone]

## 2019-08-23 NOTE — Progress Notes (Signed)
Cardiology Office Note   Date:  08/24/2019   ID:  CORTINA VULTAGGIO, DOB 1955-10-20, MRN [id number]  PCP:  Sharilyn Sites, MD  Cardiologist:  Dr. Claiborne Billings CC: Hospital Follow Up    History of Present Illness: Gloria Thompson is a 64 y.o. female who presents for posthospitalization follow-up after admission for acute ST elevated MI due to occlusion of proximal portion of left anterior descending artery.  She was initially seen at [facility], ED and was hypertensive along with chest discomfort.  A code STEMI was called and she was transferred emergently to [facility].    Cardiac catheterization on 08/10/2019 revealed proximal LAD to mid LAD 100% stenosis.  First marginal lesion was 60% stenosed.  She had a very difficult but ultimately successful PCI of totally occluded LAD with insertion of 3.0 x 30 mm Resolute Onyx stent, with resumption of TIMI-3 flow.  She was placed on antiplatelet therapy with Brilinta 90 mg twice daily and aspirin 81 mg daily.  She was placed on Toprol XL 100 mg daily along with Lipitor 80 mg.  Her echocardiogram revealed LVEF of 25% with akinesis of mild to apical anterior septal, inferior septal and anterior walls as well as apical walls apical lateral walls and true apex.  She was treated for systolic heart failure.  She will need follow-up echocardiogram July 2021.  She also requires a repeat BMET on follow-up today.  She called our office on 08/20/2019 with any large amount of questions concerning her medications.  She wanted to know why she was on her medications and what they were for.  She also had been complaining of myalgia pain over the last few days along her legs.  Several questions were answered, and if myalgias continued consideration for changing to rosuvastatin would be discussed at this office visit.  Other history includes hypertension, hyperlipidemia, and GERD.  She comes today with some soreness in her chest but otherwise is feeling well.  I have spent a  good bit of time answering questions and going over her cardiac catheterization results.  She also feels like she has some tingling in her right wrist but no significant pain or numbness.  She has not yet been contacted by cardiac rehab.  She is medically compliant.  Past Medical History:  Diagnosis Date  . Acid reflux   . CAD (coronary artery disease)    a. LHC 08/10/19: 100% of prox to mid LAD s/p DES, 60% of 1st Mrg  . Chronic systolic CHF (congestive heart failure) ([place])   . Hyperlipidemia   . Hypertension   . Ischemic cardiomyopathy    a. Echo 08/10/19: LVEF of 25%    Past Surgical History:  Procedure Laterality Date  . APPENDECTOMY    . CESAREAN SECTION  [id number]  . CORONARY/GRAFT ACUTE MI REVASCULARIZATION N/A 08/10/2019   Procedure: Coronary/Graft Acute MI Revascularization;  Surgeon: Troy Sine, MD;  Location: [facility];  Service: Cardiovascular;  Laterality: N/A;  . kidney stones    . LEFT HEART CATH AND CORONARY ANGIOGRAPHY N/A 08/10/2019   Procedure: LEFT HEART CATH AND CORONARY ANGIOGRAPHY;  Surgeon: Troy Sine, MD;  Location: [facility];  Service: Cardiovascular;  Laterality: N/A;     Current Outpatient Medications  Medication Sig Dispense Refill  . aspirin EC 81 MG tablet Take 1 tablet (81 mg total) by mouth daily. 30 tablet 6  . atorvastatin (LIPITOR) 80 MG tablet Take 0.5 tablets (40 mg total) by mouth daily.  30 tablet 6  . esomeprazole (NEXIUM) 20 MG capsule Take 20 mg by mouth daily before breakfast.      . losartan (COZAAR) 25 MG tablet Take 0.5 tablets (12.5 mg total) by mouth daily. 30 tablet 6  . metoprolol succinate (TOPROL-XL) 100 MG 24 hr tablet Take 1 tablet (100 mg total) by mouth daily. Take with or immediately following a meal. 30 tablet 6  . nitroGLYCERIN (NITROSTAT) 0.4 MG SL tablet Place 1 tablet (0.4 mg total) under the tongue every 5 (five) minutes as needed. 25 tablet 12  . spironolactone (ALDACTONE) 25 MG tablet Take 0.5  tablets (12.5 mg total) by mouth daily. 30 tablet 6  . ticagrelor (BRILINTA) 90 MG TABS tablet Take 1 tablet (90 mg total) by mouth 2 (two) times daily. 60 tablet 11   No current facility-administered medications for this visit.    Allergies:   Other and Codeine    Social History:  The patient  reports that she has never smoked. She has never used smokeless tobacco. She reports that she does not drink alcohol or use drugs.   Family History:  The patient's family history includes COPD in her sister; Diabetes in her sister; Heart attack in her father; Heart disease in her brother, brother, brother, brother, and mother; Hyperlipidemia in her sister and sister; Hypertension in her brother, brother, brother, brother, sister, and sister.    ROS: All other systems are reviewed and negative. Unless otherwise mentioned in H&P    PHYSICAL EXAM: VS:  BP 121/73   Pulse 88   Temp (!) 96.9 F (36.1 C)   Resp 20   Ht 5\' 4"  (1.626 m)   Wt 176 lb 12.8 oz (80.2 kg)   SpO2 99%   BMI 30.35 kg/m  , BMI Body mass index is 30.35 kg/m. GEN: Well nourished, well developed, in no acute distress HEENT: normal Neck: no JVD, carotid bruits, or masses Cardiac: RRR; no murmurs, rubs, or gallops,no edema  Respiratory:  Clear to auscultation bilaterally, normal work of breathing GI: soft, nontender, nondistended, + BS MS: no deformity or atrophy.  Significant ecchymosis at the right radial cardiac cath insertion site.  I removed her initial bandage from her hospitalization.  No active bleeding or hematoma is noted. Skin: warm and dry, no rash Neuro:  Strength and sensation are intact Psych: euthymic mood, full affect   EKG: Normal sinus rhythm with low voltage QRS T wave inversion V2 V3 V4 V5 and V6 along with leads I and II.  Personally reviewed.  Heart rate of 82 bpm  Recent Labs: 08/09/2019: ALT 37 08/11/2019: B Natriuretic Peptide 1,023.0 08/12/2019: BUN 15; Creatinine, Ser 1.18; Hemoglobin 13.3;  Platelets 269; Potassium 4.1; Sodium 140    Lipid Panel    Component Value Date/Time   CHOL 244 (H) 08/09/2019 2347   TRIG 154 (H) 08/09/2019 2347   HDL 52 08/09/2019 2347   CHOLHDL 4.7 08/09/2019 2347   VLDL 31 08/09/2019 2347   LDLCALC 161 (H) 08/09/2019 2347      Wt Readings from Last 3 Encounters:  08/24/19 176 lb 12.8 oz (80.2 kg)  08/12/19 187 lb 6.3 oz (85 kg)  05/13/15 159 lb (72.1 kg)      Other studies Reviewed: Left Heart Catheterization 08/10/2019:  1st Mrg lesion is 60% stenosed.  Prox LAD to Mid LAD lesion is 100% stenosed.  Post intervention, there is a 0% residual stenosis.  A stent was successfully placed.  Acute ST segment elevation myocardial  infarction secondary to total proximal occlusion of the LAD which arises from a common ostium or separate ostium from the circumflex vessel.  Dominant left circumflex vessel with 60% ostial narrowing of a very proximal marginal vessel. There is a high diagonal/ramus immediate-like vessel which arises more proximally.  Normal RCA with very small PDA.  LVEDP 25 mmHg  Very difficult but ultimately successful PCI to a totally occluded LAD with ultimate insertion of a 3.0 x 30 mm Resolute Onyx stent postdilated to 3.34 mm with the long occluded segment being reduced to 0% and resumption of brisk TIMI-3 flow in a large LAD system which wraps around the LV apex to supply the distal third of the inferior wall.  Recommendation: DAPT for minimum of 1 year. Aggressive lipid-lowering therapy with target LDL less than 70. Optimal blood pressure control. A 2D echo Doppler study will be done to assess LV function.  Diagnostic Dominance: Right  Intervention    _______________  Echocardiogram 08/10/2019: Impressions: 1. Left ventricular ejection fraction, by estimation, is 25%. The left  ventricle has severely decreased function. The left ventricle demonstrates  regional wall motion abnormalities.  Akinesis of the mid to apical  anteroseptal, inferoseptal, and anterior  walls. Akinesis of the apical inferior and apical lateral walls. Akinesis  of the true apex. No LV thrombus visualized. There is mild left  ventricular hypertrophy. Left ventricular diastolic parameters are  consistent with Grade I diastolic dysfunction  (impaired relaxation).  2. Right ventricular systolic function is normal. The right ventricular  size is normal. Tricuspid regurgitation signal is inadequate for assessing  PA pressure.  3. The aortic valve is tricuspid. Aortic valve regurgitation is not  visualized. Mild aortic valve sclerosis is present, with no evidence of  aortic valve stenosis.  4. The inferior vena cava is normal in size with greater than 50%  respiratory variability, suggesting right atrial pressure of 3 mmHg.  5. The mitral valve is normal in structure. No evidence of mitral valve  regurgitation. No evidence of mitral stenosis.   ASSESSMENT AND PLAN:  1.  Coronary artery disease: Status post ST elevation MI of the LAD.  Status post PCI to totally occluded LAD with Resolute Onyx stent.  She remains on aspirin, ticagrelor, and metoprolol.  She still has some occasional soreness in her chest but otherwise is breathing okay without any difficulty.  She will have follow-up lipids and LFTs in 3 months along with a BMET and CBC.  Of note she lives in [place] and wishes to be established in our cardiology office in [place].  Her initial encounter with our group was during her STEMI, and she would like to have cardiologist closer to home.  Follow-up appointment will be made in [place].  2.  Hypertension: Blood pressures currently controlled.  Continue losartan and metoprolol, along with spironolactone.  Labs to follow in 3 months  3.  Hyperlipidemia: She is currently on atorvastatin 80 mg daily.  Goal of LDL less than 70.  She is to have fasting lipids and LFTs in 3 months prior to  follow-up appointment in [place].  Current medicines are reviewed at length with the patient today.  I have spent 45 minutes dedicated to the care of this patient on the date of this encounter to include pre-visit review of records, answering multiple questions, assessment, management and diagnostic testing,with shared decision making.  Labs/ tests ordered today include: Fasting lipids LFTs, BMET, CBC (in 3 months) Bettey Mare. Liborio Nixon, ANP, AACC   08/24/2019  12:15 PM    [facility] [address] Office [phone] Fax [phone]  Notice: This dictation was prepared with Dragon dictation along with smaller phrase technology. Any transcriptional errors that result from this process are unintentional and may not be corrected upon review.

## 2019-08-24 ENCOUNTER — Encounter: Payer: Self-pay | Admitting: Adult Health

## 2019-08-24 ENCOUNTER — Other Ambulatory Visit: Payer: Self-pay

## 2019-08-24 ENCOUNTER — Ambulatory Visit (INDEPENDENT_AMBULATORY_CARE_PROVIDER_SITE_OTHER): Payer: Self-pay | Admitting: Adult Health

## 2019-08-24 VITALS — BP 121/73 | HR 88 | Temp 96.9°F | Resp 20 | Ht 64.0 in | Wt 176.8 lb

## 2019-08-24 DIAGNOSIS — I10 Essential (primary) hypertension: Secondary | ICD-10-CM

## 2019-08-24 DIAGNOSIS — I2510 Atherosclerotic heart disease of native coronary artery without angina pectoris: Secondary | ICD-10-CM

## 2019-08-24 DIAGNOSIS — E7800 Pure hypercholesterolemia, unspecified: Secondary | ICD-10-CM

## 2019-08-24 DIAGNOSIS — I2102 ST elevation (STEMI) myocardial infarction involving left anterior descending coronary artery: Secondary | ICD-10-CM

## 2019-08-24 LAB — BASIC METABOLIC PANEL
BUN/Creatinine Ratio: 9 — ABNORMAL LOW (ref 12–28)
BUN: 11 mg/dL (ref 8–27)
CO2: 22 mmol/L (ref 20–29)
Calcium: 10 mg/dL (ref 8.7–10.3)
Chloride: 106 mmol/L (ref 96–106)
Creatinine, Ser: 1.24 mg/dL — ABNORMAL HIGH (ref 0.57–1.00)
GFR calc Af Amer: 53 mL/min/{1.73_m2} — ABNORMAL LOW (ref >59)
GFR calc non Af Amer: 46 mL/min/{1.73_m2} — ABNORMAL LOW (ref >59)
Glucose: 89 mg/dL (ref 65–99)
Potassium: 4.9 mmol/L (ref 3.5–5.2)
Sodium: 141 mmol/L (ref 134–144)

## 2019-08-24 NOTE — Patient Instructions (Signed)
Medication Instructions:  No changes *If you need a refill on your cardiac medications before your next appointment, please call your pharmacy*   Lab Work: Your provider would like for you to have the following labs today: BMET  If you have labs (blood work) drawn today and your tests are completely normal, you will receive your results only by: Marland Kitchen MyChart Message (if you have MyChart) OR . A paper copy in the mail If you have any lab test that is abnormal or we need to change your treatment, we will call you to review the results.   Testing/Procedures: None ordered   Follow-Up: At [facility], you and your health needs are our priority.  As part of our continuing mission to provide you with exceptional heart care, we have created designated Provider Care Teams.  These Care Teams include your primary Cardiologist (physician) and Advanced Practice Providers (APPs -  Physician Assistants and Nurse Practitioners) who all work together to provide you with the care you need, when you need it.  We recommend signing up for the patient portal called "MyChart".  Sign up information is provided on this After Visit Summary.  MyChart is used to connect with patients for Virtual Visits (Telemedicine).  Patients are able to view lab/test results, encounter notes, upcoming appointments, etc.  Non-urgent messages can be sent to your provider as well.   To learn more about what you can do with MyChart, go to [email or web address].    Your next appointment:   1 month(s)  The format for your next appointment:   In Person  Provider:   Follow up in one month at the [place] office-physician or APP

## 2019-08-30 ENCOUNTER — Telehealth: Payer: Self-pay | Admitting: *Deleted

## 2019-08-30 DIAGNOSIS — Z79899 Other long term (current) drug therapy: Secondary | ICD-10-CM

## 2019-08-30 NOTE — Telephone Encounter (Signed)
-----   Message from Jodelle Gross, NP sent at 08/27/2019 10:30 AM EDT ----- Labs are reviewed. Will need to be repeated in 3 months. She is going to follow up in the [place] office from now on. Continue current regimine for now as her values are stable.

## 2019-08-30 NOTE — Telephone Encounter (Signed)
Pt aware of her blood work, BMP ordered and mailed to pt. 

## 2019-09-08 ENCOUNTER — Telehealth: Payer: Self-pay | Admitting: Cardiovascular Disease

## 2019-09-08 NOTE — Telephone Encounter (Signed)
LMTCB

## 2019-09-08 NOTE — Telephone Encounter (Signed)
New Message    Pt c/o medication issue:  1. Name of Medication: Brilinta   2. How are you currently taking this medication (dosage and times per day)? 1 tablet 2 x daily   3. Are you having a reaction (difficulty breathing--STAT)? No   4. What is your medication issue? Pt is wondering if she can get help with the cost of her Brilinta for 1 month because she will start medicare in a month    Please advise

## 2019-09-11 NOTE — Telephone Encounter (Signed)
Pt states she called over to the [facility] and was able to get a discount on some medications but will still be paying around $160 every 3 months and she is comfortable with this until she qualifies for Medicaid in the next couple of months.   Pt also complaining of eye pain and headache stating that her vision has been getting worse. I encouraged her to reach out to her PCP to be evaluated so that she can get a referral to an optometrist. She agreed with this plan.

## 2020-01-29 ENCOUNTER — Telehealth: Payer: Self-pay | Admitting: Cardiovascular Disease

## 2020-01-29 NOTE — Telephone Encounter (Signed)
Pt c/o medication issue:  1. Name of Medication: metoprolol succinate (TOPROL-XL) 100 MG 24 hr tablet  2. How are you currently taking this medication (dosage and times per day)? 1 tablet daily  3. Are you having a reaction (difficulty breathing--STAT)? no  4. What is your medication issue? She is not for sure if this medication that's causing this, but she states it's give her a headache, making her sick to her stomach, she states she smells a chemical smell, her eyes burn and hurt.

## 2020-01-29 NOTE — Telephone Encounter (Signed)
Spoke with pt, her metoprolol was increased from 50 mg to 100 mg when she was discharged from the hospital after her last MI in April. She has multiple complaints and feels they are related to the metoprolol even though it has been several months since the increase, she feels they are getting worse. She is not on any new medications since discharge. Message sent to [facility] as patient ask to follow up in the [place] office to be closer to her home. Will forward this message to dr Tresa Endo and the pharm md.

## 2020-01-29 NOTE — Telephone Encounter (Signed)
Spoke with pt, aware of pharm md recommendations. She reports she does not have a medical doctor at this time. She would like to know what dr Tresa Endo thinks. Will forward for his review. Patient voiced understanding to not stop the metoprolol abruptly.

## 2020-01-29 NOTE — Telephone Encounter (Signed)
Patient should contact PCP for assessment. Symptoms are non-specific.  Please take metoprolol with full meal to decrease any potential irritation to your stomach.

## 2020-02-04 NOTE — Telephone Encounter (Signed)
Agree with Gloria Thompson. Cannot stop abruptly; consider eval with APP if no primary MD

## 2020-02-05 NOTE — Telephone Encounter (Signed)
Spoke with pt and advised of MD's recommendations. Pt agreeable to plan. Appointment scheduled for 10/20 at 11:15 am with [place], [place].

## 2020-02-06 NOTE — Progress Notes (Signed)
Virtual Visit via Telephone Note   This visit type was conducted due to national recommendations for restrictions regarding the COVID-19 Pandemic (e.g. social distancing) in an effort to limit this patient's exposure and mitigate transmission in our community.  Due to her co-morbid illnesses, this patient is at least at moderate risk for complications without adequate follow up.  This format is felt to be most appropriate for this patient at this time.  The patient did not have access to video technology/had technical difficulties with video requiring transitioning to audio format only (telephone).  All issues noted in this document were discussed and addressed.  No physical exam could be performed with this format.  Please refer to the patient's chart for her  consent to telehealth for [facility].    Date:  02/07/2020   ID:  Gloria Thompson, DOB 1956/04/06, MRN [id number] The patient was identified using 2 identifiers.  Patient Location: Home Provider Location: Home Office  PCP:  Pcp, No  Cardiologist:  Nicki Guadalajara, MD  Electrophysiologist:  None   Evaluation Performed:  Follow-Up Visit  Chief Complaint:  Multiple complaints including chest discomfort, headache, and burning eyes.   History of Present Illness:    Gloria Thompson is a 64 y.o. female with PMH of CAD s/p STEMI 07/2019 with PCI/DES to LAD, chronic combined CHF, ischemic cardiomyopathy, HTN, HLD, and GERD, who presents today with multiple complaints.   She was last evaluated by cardiology at an outpatient visit with Joni Reining, NP 08/2019, for post-hospital follow-up after an admission for a STEMI 2/2 pLAD occlusion 07/2019. She had several questions regarding medications which were answered at that visit. Some chest soreness though no CP reminiscent of her MI. She requested to follow-up in our [place] office going forward and was recommended to follow-up in 1 month, however has not been seen since that time,  nor have there been any missed visits. She contacted our office 01/29/20 to report medication side effects including HA, nausea, chemical smell, and burning eyes. She attributed these symptoms to her metoprolol succinate and was encouraged to take this medication with a full meal. She was scheduled for this appointment to further evaluate her symptoms.   Her last ischemic evaluation was a cardiac cath at the time of her STEMI which revealed total occlusion of her LAD managed with PCI/DES with 60% OM1 stenosis which was medically managed. Her last echocardiogram 07/2019 showed EF 25%, LV RWMA with akinesis of the mid-apical anteroseptal, inferoseptal, and anterior walls, no LV thrombus, G1DD, and no significant valvular abnormalities.    She presents today for a telephone visit. She reports daily chest discomfort which occurs within an hour after taking her brilinta. She reported symptoms lasted throughout the day but do not have any exertional component. She has not tried any SL nitro. She does have some DOE which is generally unchanged. Additionally she reports HA onset shortly after taking brilinta. Blood pressures have been elevated with SBP in the 160s recently. Possible this has contributed to her HA's. She also reports a burning sensation in her eyes which sounds c/w dry eyes and unlikely to be a medication side effect. She also notes occasional brief palpitations, LE edema, and orthopnea. No complaints of PND, dizziness, lightheadedness, or syncope.  The patient does not have symptoms concerning for COVID-19 infection (fever, chills, cough, or new shortness of breath).     Past Medical History:  Diagnosis Date   Acid reflux    CAD (coronary artery disease)  a. LHC 08/10/19: 100% of prox to mid LAD s/p DES, 60% of 1st Mrg   Chronic systolic CHF (congestive heart failure) (HCC)    Hyperlipidemia    Hypertension    Ischemic cardiomyopathy    a. Echo 08/10/19: LVEF of 25%   Past Surgical  History:  Procedure Laterality Date   APPENDECTOMY     CESAREAN SECTION  [id number]   CORONARY/GRAFT ACUTE MI REVASCULARIZATION N/A 08/10/2019   Procedure: Coronary/Graft Acute MI Revascularization;  Surgeon: Lennette BihariKelly, Thomas A, MD;  [facility];  Service: Cardiovascular;  Laterality: N/A;   kidney stones     LEFT HEART CATH AND CORONARY ANGIOGRAPHY N/A 08/10/2019   Procedure: LEFT HEART CATH AND CORONARY ANGIOGRAPHY;  Surgeon: Lennette BihariKelly, Thomas A, MD;  Location: [facility] INVASIVE CV LAB;  Service: Cardiovascular;  Laterality: N/A;     Current Meds  Medication Sig   aspirin EC 81 MG tablet Take 1 tablet (81 mg total) by mouth daily.   atorvastatin (LIPITOR) 80 MG tablet Take 0.5 tablets (40 mg total) by mouth daily.   esomeprazole (NEXIUM) 20 MG capsule Take 20 mg by mouth daily before breakfast.     metoprolol succinate (TOPROL-XL) 100 MG 24 hr tablet Take 1 tablet (100 mg total) by mouth daily. Take with or immediately following a meal.   nitroGLYCERIN (NITROSTAT) 0.4 MG SL tablet Place 1 tablet (0.4 mg total) under the tongue every 5 (five) minutes as needed.   [DISCONTINUED] losartan (COZAAR) 25 MG tablet Take 0.5 tablets (12.5 mg total) by mouth daily.   [DISCONTINUED] spironolactone (ALDACTONE) 25 MG tablet Take 0.5 tablets (12.5 mg total) by mouth daily.   [DISCONTINUED] ticagrelor (BRILINTA) 90 MG TABS tablet Take 1 tablet (90 mg total) by mouth 2 (two) times daily.     Allergies:   Other and Codeine   Social History   Tobacco Use   Smoking status: Never Smoker   Smokeless tobacco: Never Used  Substance Use Topics   Alcohol use: No   Drug use: No     Family Hx: The patient's family history includes COPD in her sister; Diabetes in her sister; Heart attack in her father; Heart disease in her brother, brother, brother, brother, and mother; Hyperlipidemia in her sister and sister; Hypertension in her brother, brother, brother, brother, sister, and  sister.  ROS:   Please see the history of present illness.     All other systems reviewed and are negative.   Prior CV studies:   The following studies were reviewed today:  Echocardiogram 07/2019: 1. Left ventricular ejection fraction, by estimation, is 25%. The left  ventricle has severely decreased function. The left ventricle demonstrates  regional wall motion abnormalities. Akinesis of the mid to apical  anteroseptal, inferoseptal, and anterior  walls. Akinesis of the apical inferior and apical lateral walls. Akinesis  of the true apex. No LV thrombus visualized. There is mild left  ventricular hypertrophy. Left ventricular diastolic parameters are  consistent with Grade I diastolic dysfunction  (impaired relaxation).  2. Right ventricular systolic function is normal. The right ventricular  size is normal. Tricuspid regurgitation signal is inadequate for assessing  PA pressure.  3. The aortic valve is tricuspid. Aortic valve regurgitation is not  visualized. Mild aortic valve sclerosis is present, with no evidence of  aortic valve stenosis.  4. The inferior vena cava is normal in size with greater than 50%  respiratory variability, suggesting right atrial pressure of 3 mmHg.  5. The mitral valve  is normal in structure. No evidence of mitral valve  regurgitation. No evidence of mitral stenosis.   Left heart catheterization 07/2019:  1st Mrg lesion is 60% stenosed.  Prox LAD to Mid LAD lesion is 100% stenosed.  Post intervention, there is a 0% residual stenosis.  A stent was successfully placed.   Acute ST segment elevation myocardial infarction secondary to total proximal occlusion of the LAD which arises from a common ostium or separate ostium from the circumflex vessel.  Dominant left circumflex vessel with 60% ostial narrowing of a very proximal marginal vessel.  There is a high diagonal/ramus immediate-like vessel which arises more proximally.  Normal RCA  with very small PDA.  LVEDP 25 mmHg  Very difficult but ultimately successful PCI to a totally occluded LAD with ultimate insertion of a 3.0 x 30 mm Resolute Onyx stent postdilated to 3.34 mm with the long occluded segment being reduced to 0% and resumption of brisk TIMI-3 flow in a large LAD system which wraps around the LV apex to supply the distal third of the inferior wall.  RECOMMENDATION: DAPT for minimum of 1 year.  Aggressive lipid-lowering therapy with target LDL less than 70.  Optimal blood pressure control.  A 2D echo Doppler study will be done to assess LV function.   Labs/Other Tests and Data Reviewed:    EKG:  No ECG reviewed.  Recent Labs: 08/09/2019: ALT 37 08/11/2019: B Natriuretic Peptide 1,023.0 08/12/2019: Hemoglobin 13.3; Platelets 269 08/24/2019: BUN 11; Creatinine, Ser 1.24; Potassium 4.9; Sodium 141   Recent Lipid Panel Lab Results  Component Value Date/Time   CHOL 244 (H) 08/09/2019 11:47 PM   TRIG 154 (H) 08/09/2019 11:47 PM   HDL 52 08/09/2019 11:47 PM   CHOLHDL 4.7 08/09/2019 11:47 PM   LDLCALC 161 (H) 08/09/2019 11:47 PM    Wt Readings from Last 3 Encounters:  02/07/20 175 lb (79.4 kg)  08/24/19 176 lb 12.8 oz (80.2 kg)  08/12/19 187 lb 6.3 oz (85 kg)     Risk Assessment/Calculations:      Objective:    Vital Signs:  BP (!) 166/95    Pulse 82    Ht 5\' 4"  (1.626 m)    Wt 175 lb (79.4 kg)    BMI 30.04 kg/m    VITAL SIGNS:  reviewed GEN:  no acute distress RESPIRATORY:  speaking in full sentences without SOB CARDIOVASCULAR:  no peripheral edema NEURO:  A&O x3 PSYCH:  normal affect  ASSESSMENT & PLAN:    1. CAD s/p STEMI 07/2019 with PCI/DES to LAD: patient reports HA and chest discomfort after taking brilinta every day. Concerned this may be a medication side effect. Also with financial constraints with brilinta rx. No clear exertional component to her chest pain. Discussed with Dr. 08/2019 and will transition to plavix - Will stop brilinta   - Start plavix - load with 150mg  x1 dose on 02/08/20, then start 75mg  daily 02/09/20.  - Will update an echocardiogram to re-evaluate LV function and wall motion - Will consider further ischemic evaluation pending response to medication changes and echo results - Continue metoprolol - Continue statin - Could consider checking a sleep study to evaluate for OSA given obesity, orthopnea, and insomnia  2. Chronic combined CHF/ICM: EF 25% at the time of her STEMI 02/10/20. This has not been re-evaluated since that time to determine if there has been improvement in her LV function. She does note some intermittent DOE, orthopnea, and LE edema - Will repeat an  echocardiogram - if LV function remains low would consider transition from losartan to entresto. Additionally, if LV function remains <35% would consider referral to EP to discuss ICD placement  - Continue metoprolol succinate - Will increase losartan and spironolactone for improved BP/LE edema control - Continue a low salt diet  3. HTN: BP persistently elevated above goal with SBP in the 160s. . - Will increase losartan to 25mg  daily and spironolactone to 25mg  daily - Will check Cr and electrolytes in 1 week for close monitoring  4. HLD: LDL 161 at the time of her STEMI 07/2019 (non-fasting). No repeat labs since starting atorvastatin - Will check a CMET and FLP in 1 week  - Continue atorvastatin   COVID-19 Education: The signs and symptoms of COVID-19 were discussed with the patient and how to seek care for testing (follow up with PCP or arrange E-visit).  The importance of social distancing was discussed today.  Time:   Today, I have spent 25 minutes with the patient with telehealth technology discussing the above problems.     Medication Adjustments/Labs and Tests Ordered: Current medicines are reviewed at length with the patient today.  Concerns regarding medicines are outlined above.   Tests Ordered: Orders Placed This Encounter   Procedures   Lipid panel   Comprehensive metabolic panel   ECHOCARDIOGRAM COMPLETE    Medication Changes: Meds ordered this encounter  Medications   clopidogrel (PLAVIX) 75 MG tablet    Sig: Take 1 tablet (75 mg total) by mouth daily.    Dispense:  90 tablet    Refill:  3   losartan (COZAAR) 25 MG tablet    Sig: Take 1 tablet (25 mg total) by mouth daily.    Dispense:  30 tablet    Refill:  6   spironolactone (ALDACTONE) 25 MG tablet    Sig: Take 1 tablet (25 mg total) by mouth daily.    Dispense:  30 tablet    Refill:  6    Follow Up:  In Person with Dr. 03/04/20 as previously scheduled.  Signed, Diona Browner, PA-C  02/07/2020 9:45 PM    [facility]

## 2020-02-07 ENCOUNTER — Telehealth: Payer: Self-pay | Admitting: *Deleted

## 2020-02-07 ENCOUNTER — Telehealth (INDEPENDENT_AMBULATORY_CARE_PROVIDER_SITE_OTHER): Payer: PPO | Admitting: Medical

## 2020-02-07 ENCOUNTER — Encounter: Payer: Self-pay | Admitting: Medical

## 2020-02-07 VITALS — BP 166/95 | HR 82 | Ht 64.0 in | Wt 175.0 lb

## 2020-02-07 DIAGNOSIS — I25118 Atherosclerotic heart disease of native coronary artery with other forms of angina pectoris: Secondary | ICD-10-CM | POA: Diagnosis not present

## 2020-02-07 DIAGNOSIS — I5042 Chronic combined systolic (congestive) and diastolic (congestive) heart failure: Secondary | ICD-10-CM

## 2020-02-07 DIAGNOSIS — I10 Essential (primary) hypertension: Secondary | ICD-10-CM

## 2020-02-07 DIAGNOSIS — I255 Ischemic cardiomyopathy: Secondary | ICD-10-CM | POA: Diagnosis not present

## 2020-02-07 DIAGNOSIS — E785 Hyperlipidemia, unspecified: Secondary | ICD-10-CM

## 2020-02-07 MED ORDER — LOSARTAN POTASSIUM 25 MG PO TABS: 25.0000 mg | ORAL_TABLET | Freq: Every day | ORAL | 6 refills | Status: DC

## 2020-02-07 MED ORDER — SPIRONOLACTONE 25 MG PO TABS: 25.0000 mg | ORAL_TABLET | Freq: Every day | ORAL | 6 refills | Status: DC

## 2020-02-07 MED ORDER — CLOPIDOGREL BISULFATE 75 MG PO TABS: 75.0000 mg | ORAL_TABLET | Freq: Every day | ORAL | 3 refills | Status: DC

## 2020-02-07 NOTE — Telephone Encounter (Signed)
RN spoke to patient. Instruction were given  from today's virtual visit 02/07/20 .  AVS SUMMARY has been mailed.  New prescription e-sent to pharmacy  labslip mailed. Patient aware echo th be done prior to  Appointment 03/04/20   Patient verbalized understanding

## 2020-02-07 NOTE — Telephone Encounter (Signed)
°  Patient Consent for Virtual Visit         Gloria Thompson has provided verbal consent on 02/07/2020 for a virtual visit (video or telephone).   CONSENT FOR VIRTUAL VISIT FOR:  Gloria Thompson  By participating in this virtual visit I agree to the following:  I hereby voluntarily request, consent and authorize [facility] and its employed or contracted physicians, physician assistants, nurse practitioners or other licensed health care professionals (the Practitioner), to provide me with telemedicine health care services (the Services") as deemed necessary by the treating Practitioner. I acknowledge and consent to receive the Services by the Practitioner via telemedicine. I understand that the telemedicine visit will involve communicating with the Practitioner through live audiovisual communication technology and the disclosure of certain medical information by electronic transmission. I acknowledge that I have been given the opportunity to request an in-person assessment or other available alternative prior to the telemedicine visit and am voluntarily participating in the telemedicine visit.  I understand that I have the right to withhold or withdraw my consent to the use of telemedicine in the course of my care at any time, without affecting my right to future care or treatment, and that the Practitioner or I may terminate the telemedicine visit at any time. I understand that I have the right to inspect all information obtained and/or recorded in the course of the telemedicine visit and may receive copies of available information for a reasonable fee.  I understand that some of the potential risks of receiving the Services via telemedicine include:   Delay or interruption in medical evaluation due to technological equipment failure or disruption;  Information transmitted may not be sufficient (e.g. poor resolution of images) to allow for appropriate medical decision making by the  Practitioner; and/or   In rare instances, security protocols could fail, causing a breach of personal health information.  Furthermore, I acknowledge that it is my responsibility to provide information about my medical history, conditions and care that is complete and accurate to the best of my ability. I acknowledge that Practitioner's advice, recommendations, and/or decision may be based on factors not within their control, such as incomplete or inaccurate data provided by me or distortions of diagnostic images or specimens that may result from electronic transmissions. I understand that the practice of medicine is not an exact science and that Practitioner makes no warranties or guarantees regarding treatment outcomes. I acknowledge that a copy of this consent can be made available to me via my patient portal [facility] MyChart), or I can request a printed copy by calling the office of [facility].    I understand that my insurance will be billed for this visit.   I have read or had this consent read to me.  I understand the contents of this consent, which adequately explains the benefits and risks of the Services being provided via telemedicine.   I have been provided ample opportunity to ask questions regarding this consent and the Services and have had my questions answered to my satisfaction.  I give my informed consent for the services to be provided through the use of telemedicine in my medical care

## 2020-02-07 NOTE — Patient Instructions (Addendum)
Medication Instructions:     will stop brilinta and start plavix. Please take plavix ( clopidogrel) 150mg   ( 2 tablets) on 02/08/20, then start 75 mg daily 02/09/20  Increase Losartan  To 25 mg ( One whole) tablet daily  increase taking  Spironolactone 25 mg  ( one whole) tablet daily   *If you need a refill on your cardiac medications before your next appointment, please call your pharmacy*   Lab Work: labwork in one week  May use [facility] in [place]   LIPID- FASTING CMP If you have labs (blood work) drawn today and your tests are completely normal, you will receive your results only by: [facility] MyChart Message (if you have MyChart) OR . A paper copy in the mail If you have any lab test that is abnormal or we need to change your treatment, we will call you to review the results.   Testing/Procedures: WILL BE SCHEDULE AT [facility]  --PRIOR TO APPOINTMENT WITH DR MCDOWELL Your physician has requested that you have an echocardiogram. Echocardiography is a painless test that uses sound waves to create images of your heart. It provides your doctor with information about the size and shape of your heart and how well your heart's chambers and valves are working. This procedure takes approximately one hour. There are no restrictions for this procedure.    Follow-Up: At [facility], you and your health needs are our priority.  As part of our continuing mission to provide you with exceptional heart care, we have created designated Provider Care Teams.  These Care Teams include your primary Cardiologist (physician) and Advanced Practice Providers (APPs -  Physician Assistants and Nurse Practitioners) who all work together to provide you with the care you need, when you need it.  We recommend signing up for the patient portal called "MyChart".  Sign up information is provided on this After Visit Summary.  MyChart is used to connect with patients for Virtual Visits (Telemedicine).   Patients are able to view lab/test results, encounter notes, upcoming appointments, etc.  Non-urgent messages can be sent to your provider as well.   To learn more about what you can do with MyChart, go to [facility].    Your next appointment:    03/04/20   The format for your next appointment:   In Person  Provider:    Dr 03/06/20     Other Instructions You should keep a log of your blood pressures daily to bring to your follow-up visit with Dr. Diona Browner 03/04/20.

## 2020-02-08 ENCOUNTER — Telehealth: Payer: Self-pay | Admitting: Medical

## 2020-02-08 NOTE — Telephone Encounter (Signed)
Spoke with patient regarding the appointment for the Echo ordered by Judy Pimple, PA---scheduled Thursday 02/29/20 at 10:30 am at [facility] time is 10:00 am at the registration desk in the main lobby of the hospital---patient voiced her understanding.

## 2020-02-28 ENCOUNTER — Other Ambulatory Visit (HOSPITAL_COMMUNITY)
Admission: RE | Admit: 2020-02-28 | Discharge: 2020-02-28 | Disposition: A | Discharge status: Home / Self Care | Payer: PPO | Source: Intra-hospital | Attending: Medical | Admitting: Medical

## 2020-02-28 ENCOUNTER — Other Ambulatory Visit: Payer: Self-pay

## 2020-02-28 DIAGNOSIS — E785 Hyperlipidemia, unspecified: Secondary | ICD-10-CM | POA: Diagnosis not present

## 2020-02-28 DIAGNOSIS — I255 Ischemic cardiomyopathy: Secondary | ICD-10-CM | POA: Diagnosis not present

## 2020-02-28 LAB — LIPID PANEL
Cholesterol: 138 mg/dL (ref 0–200)
HDL: 37 mg/dL — ABNORMAL LOW (ref >40)
LDL Cholesterol: 83 mg/dL (ref 0–99)
Total CHOL/HDL Ratio: 3.7 RATIO
Triglycerides: 88 mg/dL (ref <150)
VLDL: 18 mg/dL (ref 0–40)

## 2020-02-28 LAB — COMPREHENSIVE METABOLIC PANEL
ALT: 20 U/L (ref 0–44)
AST: 21 U/L (ref 15–41)
Albumin: 4.1 g/dL (ref 3.5–5.0)
Alkaline Phosphatase: 86 U/L (ref 38–126)
Anion gap: 9 (ref 5–15)
BUN: 16 mg/dL (ref 8–23)
CO2: 24 mmol/L (ref 22–32)
Calcium: 9 mg/dL (ref 8.9–10.3)
Chloride: 103 mmol/L (ref 98–111)
Creatinine, Ser: 1.04 mg/dL — ABNORMAL HIGH (ref 0.44–1.00)
GFR, Estimated: 60 mL/min — ABNORMAL LOW (ref >60)
Glucose, Bld: 102 mg/dL — ABNORMAL HIGH (ref 70–99)
Potassium: 4 mmol/L (ref 3.5–5.1)
Sodium: 136 mmol/L (ref 135–145)
Total Bilirubin: 0.7 mg/dL (ref 0.3–1.2)
Total Protein: 7.4 g/dL (ref 6.5–8.1)

## 2020-02-29 ENCOUNTER — Other Ambulatory Visit: Payer: Self-pay

## 2020-02-29 ENCOUNTER — Ambulatory Visit (HOSPITAL_COMMUNITY)
Admission: RE | Admit: 2020-02-29 | Discharge: 2020-02-29 | Disposition: A | Discharge status: Home / Self Care | Payer: PPO | Source: Ambulatory Visit | Attending: Medical | Admitting: Medical

## 2020-02-29 DIAGNOSIS — I5042 Chronic combined systolic (congestive) and diastolic (congestive) heart failure: Secondary | ICD-10-CM

## 2020-02-29 DIAGNOSIS — I255 Ischemic cardiomyopathy: Secondary | ICD-10-CM

## 2020-02-29 LAB — ECHOCARDIOGRAM COMPLETE
Area-P 1/2: 2.52 cm2
Calc EF: 56.3 %
S' Lateral: 2.87 cm
Single Plane A2C EF: 51.9 %
Single Plane A4C EF: 62.8 %

## 2020-02-29 NOTE — Progress Notes (Signed)
*  PRELIMINARY RESULTS* Echocardiogram 2D Echocardiogram has been performed.  Gloria Thompson 02/29/2020, 11:14 AM

## 2020-03-03 ENCOUNTER — Encounter: Payer: Self-pay | Admitting: Cardiology

## 2020-03-03 NOTE — Progress Notes (Signed)
Cardiology Office Note  Date: 03/04/2020   ID: Gloria Thompson, DOB 12/23/1955, MRN [id number]  PCP:  Gloria Found, MD  Cardiologist:  Gloria Dell, MD Electrophysiologist:  None   Chief Complaint  Patient presents with  . Cardiac follow-up    History of Present Illness: Gloria Thompson is a 64 y.o. female patient of Dr. Tresa Endo presenting to the [place] office to establish follow-up with me.  This is our first meeting today.  I reviewed extensive records and updated the chart.  She was most recently evaluated via telehealth encounter by Ms. Gloria Thompson in late October.  She presents reporting no obvious angina symptoms or nitroglycerin use. States that she has had a fluttering sensation associated with chest discomfort that occurs intermittently, sometimes as much as every other day. No associated syncope. No history of cardiac arrhythmia.  She was transitioned from Brilinta to Plavix after encounter in October per chart review.  She had a recent follow-up echocardiogram as noted below indicating improvement in LVEF to the range of 55 to 60%, no regional wall motion abnormalities. We discussed the results today.  Recent follow-up lab work showed improvement in LDL from 161 down to 83 on statin therapy.  I reviewed her home blood pressure checks as well as today's measurement. We will increase her losartan to 50 mg daily.  Past Medical History:  Diagnosis Date  . Acid reflux   . CAD (coronary artery disease)    a. LHC 08/10/19: 100% of prox to mid LAD s/p DES, 60% of 1st Mrg  . Essential hypertension   . History of cardiomyopathy    Normalization of LVEF 02/2020  . Hyperlipidemia     Past Surgical History:  Procedure Laterality Date  . APPENDECTOMY    . CESAREAN SECTION  [id number]  . CORONARY/GRAFT ACUTE MI REVASCULARIZATION N/A 08/10/2019   Procedure: Coronary/Graft Acute MI Revascularization;  Surgeon: Gloria Bihari, MD;  Location: [facility] INVASIVE CV LAB;   Service: Cardiovascular;  Laterality: N/A;  . kidney stones    . LEFT HEART CATH AND CORONARY ANGIOGRAPHY N/A 08/10/2019   Procedure: LEFT HEART CATH AND CORONARY ANGIOGRAPHY;  Surgeon: Gloria Bihari, MD;  Location: [facility] INVASIVE CV LAB;  Service: Cardiovascular;  Laterality: N/A;    Current Outpatient Medications  Medication Sig Dispense Refill  . aspirin EC 81 MG tablet Take 1 tablet (81 mg total) by mouth daily. 30 tablet 6  . atorvastatin (LIPITOR) 80 MG tablet Take 0.5 tablets (40 mg total) by mouth daily. 30 tablet 6  . clopidogrel (PLAVIX) 75 MG tablet Take 1 tablet (75 mg total) by mouth daily. 90 tablet 3  . esomeprazole (NEXIUM) 20 MG capsule Take 20 mg by mouth daily before breakfast.      . metoprolol succinate (TOPROL-XL) 100 MG 24 hr tablet Take 1 tablet (100 mg total) by mouth daily. Take with or immediately following a meal. 30 tablet 6  . nitroGLYCERIN (NITROSTAT) 0.4 MG SL tablet Place 1 tablet (0.4 mg total) under the tongue every 5 (five) minutes as needed. (Patient taking differently: Place 0.4 mg under the tongue every 5 (five) minutes x 3 doses as needed for chest pain (if no relief after 2nd dose, proceed to the ED for an evaluation or call [phone]). ) 25 tablet 12  . spironolactone (ALDACTONE) 25 MG tablet Take 1 tablet (25 mg total) by mouth daily. 30 tablet 6  . losartan (COZAAR) 50 MG tablet Take 1 tablet (50 mg total) by  mouth daily. 90 tablet 2   No current facility-administered medications for this visit.   Allergies:  Other and Codeine   ROS: No syncope.  Physical Exam: VS:  BP (!) 160/100 (BP Location: Left Arm, Cuff Size: Large)   Pulse 74   Ht 5\' 4"  (1.626 m)   Wt 188 lb 12.8 oz (85.6 kg)   SpO2 98%   BMI 32.41 kg/m , BMI Body mass index is 32.41 kg/m.  Wt Readings from Last 3 Encounters:  03/04/20 188 lb 12.8 oz (85.6 kg)  02/07/20 175 lb (79.4 kg)  08/24/19 176 lb 12.8 oz (80.2 kg)    General: Patient appears comfortable at rest. HEENT:  Conjunctiva and lids normal, wearing a mask. Neck: Supple, no elevated JVP or carotid bruits, no thyromegaly. Lungs: Clear to auscultation, nonlabored breathing at rest. Cardiac: Regular rate and rhythm, no S3 or significant systolic murmur, no pericardial rub. Extremities: No pitting edema.  ECG:  An ECG dated 08/24/2019 was personally reviewed today and demonstrated:  Sinus rhythm with evolutionary changes of anterolateral infarct.  Recent Labwork: 08/11/2019: B Natriuretic Peptide 1,023.0 08/12/2019: Hemoglobin 13.3; Platelets 269 02/28/2020: ALT 20; AST 21; BUN 16; Creatinine, Ser 1.04; Potassium 4.0; Sodium 136     Component Value Date/Time   CHOL 138 02/28/2020 1035   TRIG 88 02/28/2020 1035   HDL 37 (L) 02/28/2020 1035   CHOLHDL 3.7 02/28/2020 1035   VLDL 18 02/28/2020 1035   LDLCALC 83 02/28/2020 1035    Other Studies Reviewed Today:  Cardiac catheterization 08/10/2019:  1st Mrg lesion is 60% stenosed.  Prox LAD to Mid LAD lesion is 100% stenosed.  Post intervention, there is a 0% residual stenosis.  A stent was successfully placed.   Acute ST segment elevation myocardial infarction secondary to total proximal occlusion of the LAD which arises from a common ostium or separate ostium from the circumflex vessel.  Dominant left circumflex vessel with 60% ostial narrowing of a very proximal marginal vessel.  There is a high diagonal/ramus immediate-like vessel which arises more proximally.  Normal RCA with very small PDA.  LVEDP 25 mmHg  Very difficult but ultimately successful PCI to a totally occluded LAD with ultimate insertion of a 3.0 x 30 mm Resolute Onyx stent postdilated to 3.34 mm with the long occluded segment being reduced to 0% and resumption of brisk TIMI-3 flow in a large LAD system which wraps around the LV apex to supply the distal third of the inferior wall.  RECOMMENDATION: DAPT for minimum of 1 year.  Aggressive lipid-lowering therapy with target  LDL less than 70.  Optimal blood pressure control.  A 2D echo Doppler study will be done to assess LV function.  Echocardiogram 02/29/2020: 1. Left ventricular ejection fraction, by estimation, is 55 to 60%. The  left ventricle has normal function. The left ventricle has no regional  wall motion abnormalities. There is mild left ventricular hypertrophy.  Left ventricular diastolic parameters  are indeterminate.  2. Right ventricular systolic function is normal. The right ventricular  size is normal. Tricuspid regurgitation signal is inadequate for assessing  PA pressure.  3. Left atrial size was upper normal.  4. The mitral valve is grossly normal. Trivial mitral valve  regurgitation.  5. The aortic valve is tricuspid. Aortic valve regurgitation is not  visualized.  6. The inferior vena cava is normal in size with greater than 50%  respiratory variability, suggesting right atrial pressure of 3 mmHg.   Assessment and Plan:  1.  CAD status post DES to the proximal/mid LAD in April of this year. She had evidence of cardiomyopathy at that time with follow-up echocardiogram recently showing normalization of LVEF in the range of 55 to 60%. She reports no obvious angina at this time. Continue aspirin, Plavix, Lipitor, losartan, Toprol-XL, Aldactone, and as needed nitroglycerin.  2. Intermittent palpitations as discussed above, no associated syncope. We will obtain a 7-day Zio patch for further investigation of any potential arrhythmias.  3. Essential hypertension, blood pressure elevated, including home checks with systolics in the 140s to 150s at peak. Increase losartan to 50 mg daily.  4. Mixed hyperlipidemia, tolerating Lipitor 40 mg daily. LDL has come down to 83 from 161. Will reevaluate at future visit determine if dose needs to be uptitrated.  Medication Adjustments/Labs and Tests Ordered: Current medicines are reviewed at length with the patient today.  Concerns regarding  medicines are outlined above.   Tests Ordered: Orders Placed This Encounter  Procedures  . LONG TERM MONITOR (3-14 DAYS)    Medication Changes: Meds ordered this encounter  Medications  . losartan (COZAAR) 50 MG tablet    Sig: Take 1 tablet (50 mg total) by mouth daily.    Dispense:  90 tablet    Refill:  2    03/04/2020 dose increase    Disposition:  Follow up 6 months in the [place] office.  Signed, Jonelle Sidle, MD, [facility] 03/04/2020 2:29 PM    [facility] at [facility] [address] [place], [place] [address] Phone: [phone]; Fax: [phone]

## 2020-03-04 ENCOUNTER — Other Ambulatory Visit: Payer: Self-pay

## 2020-03-04 ENCOUNTER — Encounter: Payer: Self-pay | Admitting: Cardiology

## 2020-03-04 ENCOUNTER — Ambulatory Visit: Payer: PPO | Admitting: Cardiology

## 2020-03-04 VITALS — BP 160/100 | HR 74 | Ht 64.0 in | Wt 188.8 lb

## 2020-03-04 DIAGNOSIS — I10 Essential (primary) hypertension: Secondary | ICD-10-CM | POA: Diagnosis not present

## 2020-03-04 DIAGNOSIS — I25119 Atherosclerotic heart disease of native coronary artery with unspecified angina pectoris: Secondary | ICD-10-CM

## 2020-03-04 DIAGNOSIS — E782 Mixed hyperlipidemia: Secondary | ICD-10-CM | POA: Diagnosis not present

## 2020-03-04 DIAGNOSIS — R002 Palpitations: Secondary | ICD-10-CM | POA: Diagnosis not present

## 2020-03-04 MED ORDER — LOSARTAN POTASSIUM 50 MG PO TABS: 50.0000 mg | ORAL_TABLET | Freq: Every day | ORAL | 2 refills | Status: DC

## 2020-03-04 NOTE — Patient Instructions (Addendum)
Medication Instructions:   Your physician has recommended you make the following change in your medication:   Increase losartan to 50 mg daily. You may take (2) of your 25 mg tablets daily until finished.  Continue other medications the same  Labwork:  None  Testing/Procedures: ZIO- Long Term Monitor Instructions   Your physician has requested you wear your ZIO patch monitor 7 days.   This is a single patch monitor.  [employer] supplies one patch monitor per enrollment.  Additional stickers are not available.   Please do not apply patch if you will be having a Nuclear Stress Test, Echocardiogram, Cardiac CT, MRI, or Chest Xray during the time frame you would be wearing the monitor. The patch cannot be worn during these tests.  You cannot remove and re-apply the ZIO XT patch monitor.     Once you have received you monitor, please review enclosed instructions.  Your monitor has already been registered assigning a specific monitor serial # to you.   Applying the monitor   Shave hair from upper left chest.   Hold abrader disc by orange tab.  Rub abrader in 40 strokes over left upper chest as indicated in your monitor instructions.   Clean area with 4 enclosed alcohol pads .  Use all pads to assure are is cleaned thoroughly.  Let dry.   Apply patch as indicated in monitor instructions.  Patch will be place under collarbone on left side of chest with arrow pointing upward.   Rub patch adhesive wings for 2 minutes.Remove white label marked "1".  Remove white label marked "2".  Rub patch adhesive wings for 2 additional minutes.   While looking in a mirror, press and release button in center of patch.  A small green light will flash 3-4 times .  This will be your only indicator the monitor has been turned on.     Do not shower for the first 24 hours.  You may shower after the first 24 hours.   Press button if you feel a symptom. You will hear a small click.  Record Date, Time and Symptom  in the Patient Log Book.   When you are ready to remove patch, follow instructions on last 2 pages of Patient Log Book.  Stick patch monitor onto last page of Patient Log Book.   Place Patient Log Book in [place] box.  Use locking tab on box and tape box closed securely.  The [employer] and [employer] has [employer] on it.  Please place in mailbox as soon as possible.  Your physician should have your test results approximately 7 days after the monitor has been mailed back to [facility].   Call [facility] Customer Care at [phone] if you have questions regarding your ZIO XT patch monitor.  Call them immediately if you see an orange light blinking on your monitor.    If your monitor falls off in less than 4 days contact our Monitor department at [phone].  If your monitor becomes loose or falls off after 4 days call [employer] at [phone] for suggestions on securing your monitor.  Follow-Up:  Your physician recommends that you schedule a follow-up appointment in: 6 months in [place].    Any Other Special Instructions Will Be Listed Below (If Applicable).  If you need a refill on your cardiac medications before your next appointment, please call your pharmacy.

## 2020-03-20 DIAGNOSIS — R002 Palpitations: Secondary | ICD-10-CM | POA: Diagnosis not present

## 2020-03-21 ENCOUNTER — Other Ambulatory Visit: Payer: Self-pay

## 2020-03-21 ENCOUNTER — Other Ambulatory Visit (INDEPENDENT_AMBULATORY_CARE_PROVIDER_SITE_OTHER): Payer: PPO

## 2020-03-21 DIAGNOSIS — R002 Palpitations: Secondary | ICD-10-CM | POA: Diagnosis not present

## 2020-04-03 ENCOUNTER — Telehealth: Payer: Self-pay | Admitting: Cardiology

## 2020-04-03 NOTE — Telephone Encounter (Signed)
Losartan was increased to 50 mg (from 25 mg) on 03/04/20  Patient is taking 25 mg BID  She complains that she has nausea and headache which she believes is from the medication. I suggested she see her primary care also.   She reports Systolic pressures in the 150-160 range and her diastolic are 75-85. I asked her to keep a daily record to have precise readings.

## 2020-04-03 NOTE — Telephone Encounter (Signed)
Patient states she is having problems with the mg increase for Losartan and would like to talk to the nurse.

## 2020-04-09 ENCOUNTER — Other Ambulatory Visit: Payer: Self-pay

## 2020-04-09 ENCOUNTER — Ambulatory Visit: Payer: PPO | Admitting: Student

## 2020-04-09 ENCOUNTER — Encounter: Payer: Self-pay | Admitting: Student

## 2020-04-09 VITALS — BP 150/98 | HR 76 | Ht 64.0 in | Wt 190.4 lb

## 2020-04-09 DIAGNOSIS — I25118 Atherosclerotic heart disease of native coronary artery with other forms of angina pectoris: Secondary | ICD-10-CM

## 2020-04-09 DIAGNOSIS — I10 Essential (primary) hypertension: Secondary | ICD-10-CM

## 2020-04-09 DIAGNOSIS — Z8679 Personal history of other diseases of the circulatory system: Secondary | ICD-10-CM

## 2020-04-09 DIAGNOSIS — R002 Palpitations: Secondary | ICD-10-CM

## 2020-04-09 DIAGNOSIS — E785 Hyperlipidemia, unspecified: Secondary | ICD-10-CM

## 2020-04-09 DIAGNOSIS — Z79899 Other long term (current) drug therapy: Secondary | ICD-10-CM

## 2020-04-09 MED ORDER — LISINOPRIL 20 MG PO TABS: 20.0000 mg | ORAL_TABLET | Freq: Every day | ORAL | 3 refills | Status: DC

## 2020-04-09 NOTE — Patient Instructions (Signed)
Medication Instructions:  STOP Losartan   START Lisinopril 20 mg daily   *If you need a refill on your cardiac medications before your next appointment, please call your pharmacy*   Lab Work: BMET in 2 weeks    If you have labs (blood work) drawn today and your tests are completely normal, you will receive your results only by: Marland Kitchen MyChart Message (if you have MyChart) OR . A paper copy in the mail If you have any lab test that is abnormal or we need to change your treatment, we will call you to review the results.   Testing/Procedures: None today   Follow-Up: At [facility], you and your health needs are our priority.  As part of our continuing mission to provide you with exceptional heart care, we have created designated Provider Care Teams.  These Care Teams include your primary Cardiologist (physician) and Advanced Practice Providers (APPs -  Physician Assistants and Nurse Practitioners) who all work together to provide you with the care you need, when you need it.  We recommend signing up for the patient portal called "MyChart".  Sign up information is provided on this After Visit Summary.  MyChart is used to connect with patients for Virtual Visits (Telemedicine).  Patients are able to view lab/test results, encounter notes, upcoming appointments, etc.  Non-urgent messages can be sent to your provider as well.   To learn more about what you can do with MyChart, go to [email or web address].    Your next appointment:  Keep apt in May with Dr.McDowell      Thank you for choosing [facility] !

## 2020-04-09 NOTE — Progress Notes (Signed)
Cardiology Office Note    Date:  04/09/2020   ID:  Gloria, Thompson 1955/08/08, MRN [id number]  PCP:  Gloria Found, MD  Cardiologist: Gloria Dell, MD    Chief Complaint  Patient presents with  . Follow-up    Elevated BP    History of Present Illness:    Gloria Thompson is a 64 y.o. female with past medical history of CAD (s/p STEMI in 07/2019 with DES to LAD), chronic combined systolic and diastolic CHF/ICM (EF 25% by echo in 07/2019, normalized to 55-60% by repeat echo in 02/2020), HTN and HLD who presents to the office today for follow-up in regards to elevated BP.  She was last examined by Dr. Diona Browner in 02/2020 and reported occasional fluttering sensations in her chest. Also reported occasional chest pain but was felt to be atypical for angina. Her BP was elevated to 160/100 during her office visit and it was recommended to increase losartan to 50 mg daily. Given her intermittent palpitations, a 7-day Zio patch was recommended for further evaluation. This showed predominantly sinus rhythm with an average heart rate of 73 bpm. She did have rare PAC's and rare PVC's which were less than 1% of total beats. She did have brief episodes of SVT with the longest being 10 beats. She was already on Toprol-XL 100 mg daily and it was recommended that this could be further titrated to 125 mg daily she wished to continue her current regimen at that time.  She called the office earlier this month reporting nausea and headaches following dose adjustment of Losartan. She was encouraged to follow-up with her PCP and keep a BP log. Follow-up was arranged in our office for today.  In talking with the patient today, she reports having headaches and dizziness when on Brilinta in the past but symptoms resolved after being switched to Plavix. Following recent adjustment of Losartan, she developed worsening headaches along with nausea and dizziness. She has tried dividing her Losartan to 25 mg  twice daily but reports similar symptoms. She does report occasional episodes of chest pain which occur sporadically but denies any exertional symptoms or symptoms resembling her prior angina. No recent orthopnea, PND or lower extremity edema.   Past Medical History:  Diagnosis Date  . Acid reflux   . CAD (coronary artery disease)    a. LHC 08/10/19: 100% of prox to mid LAD s/p DES, 60% of 1st Mrg  . Essential hypertension   . History of cardiomyopathy    Normalization of LVEF 02/2020  . Hyperlipidemia     Past Surgical History:  Procedure Laterality Date  . APPENDECTOMY    . CESAREAN SECTION  [id number]  . CORONARY/GRAFT ACUTE MI REVASCULARIZATION N/A 08/10/2019   Procedure: Coronary/Graft Acute MI Revascularization;  Surgeon: Lennette Bihari, MD;  Location: [facility] INVASIVE CV LAB;  Service: Cardiovascular;  Laterality: N/A;  . kidney stones    . LEFT HEART CATH AND CORONARY ANGIOGRAPHY N/A 08/10/2019   Procedure: LEFT HEART CATH AND CORONARY ANGIOGRAPHY;  Surgeon: Lennette Bihari, MD;  Location: [facility] INVASIVE CV LAB;  Service: Cardiovascular;  Laterality: N/A;    Current Medications: Outpatient Medications Prior to Visit  Medication Sig Dispense Refill  . aspirin EC 81 MG tablet Take 1 tablet (81 mg total) by mouth daily. 30 tablet 6  . atorvastatin (LIPITOR) 80 MG tablet Take 0.5 tablets (40 mg total) by mouth daily. 30 tablet 6  . clopidogrel (PLAVIX) 75 MG tablet Take 1 tablet (75  mg total) by mouth daily. 90 tablet 3  . esomeprazole (NEXIUM) 20 MG capsule Take 20 mg by mouth daily before breakfast.    . metoprolol succinate (TOPROL-XL) 100 MG 24 hr tablet Take 1 tablet (100 mg total) by mouth daily. Take with or immediately following a meal. 30 tablet 6  . nitroGLYCERIN (NITROSTAT) 0.4 MG SL tablet Place 1 tablet (0.4 mg total) under the tongue every 5 (five) minutes as needed. (Patient taking differently: Place 0.4 mg under the tongue every 5 (five) minutes x 3 doses as needed for chest  pain (if no relief after 2nd dose, proceed to the ED for an evaluation or call [phone]).) 25 tablet 12  . spironolactone (ALDACTONE) 25 MG tablet Take 1 tablet (25 mg total) by mouth daily. 30 tablet 6  . losartan (COZAAR) 50 MG tablet Take 1 tablet (50 mg total) by mouth daily. 90 tablet 2   No facility-administered medications prior to visit.     Allergies:   Other and Codeine   Social History   Socioeconomic History  . Marital status: Widowed    Spouse name: Not on file  . Number of children: Not on file  . Years of education: Not on file  . Highest education level: Not on file  Occupational History  . Not on file  Tobacco Use  . Smoking status: Never Smoker  . Smokeless tobacco: Never Used  Vaping Use  . Vaping Use: Never used  Substance and Sexual Activity  . Alcohol use: No  . Drug use: No  . Sexual activity: Not on file  Other Topics Concern  . Not on file  Social History Narrative  . Not on file   Social Determinants of Health   Financial Resource Strain: Not on file  Food Insecurity: Not on file  Transportation Needs: Not on file  Physical Activity: Not on file  Stress: Not on file  Social Connections: Not on file     Family History:  The patient's family history includes COPD in her sister; Diabetes in her sister; Heart attack in her father; Heart disease in her brother, brother, brother, brother, and mother; Hyperlipidemia in her sister and sister; Hypertension in her brother, brother, brother, brother, sister, and sister.   Review of Systems:   Please see the history of present illness.     General:  No chills, fever, night sweats or weight changes. Positive for dizziness and headaches.  Cardiovascular:  No chest pain, dyspnea on exertion, edema, orthopnea, palpitations, paroxysmal nocturnal dyspnea. Dermatological: No rash, lesions/masses Respiratory: No cough, dyspnea Urologic: No hematuria, dysuria Abdominal:   No vomiting, diarrhea, bright red blood  per rectum, melena, or hematemesis. Positive for nausea.  Neurologic:  No visual changes, wkns, changes in mental status. All other systems reviewed and are otherwise negative except as noted above.   Physical Exam:    VS:  BP (!) 150/98   Pulse 76   Ht 5\' 4"  (1.626 m)   Wt 190 lb 6.4 oz (86.4 kg)   SpO2 96%   BMI 32.68 kg/m    General: Well developed, well nourished,female appearing in no acute distress. Head: Normocephalic, atraumatic. Neck: No carotid bruits. JVD not elevated.  Lungs: Respirations regular and unlabored, without wheezes or rales.  Heart: Regular rate and rhythm. No S3 or S4.  No murmur, no rubs, or gallops appreciated. Abdomen: Appears non-distended. No obvious abdominal masses. Msk:  Strength and tone appear normal for age. No obvious joint deformities or effusions.  Extremities: No clubbing or cyanosis. No lower extremity edema.  Distal pedal pulses are 2+ bilaterally. Neuro: Alert and oriented X 3. Moves all extremities spontaneously. No focal deficits noted. Psych:  Responds to questions appropriately with a normal affect. Skin: No rashes or lesions noted  Wt Readings from Last 3 Encounters:  04/09/20 190 lb 6.4 oz (86.4 kg)  03/04/20 188 lb 12.8 oz (85.6 kg)  02/07/20 175 lb (79.4 kg)     Studies/Labs Reviewed:   EKG:  EKG is not ordered today.    Recent Labs: 08/11/2019: B Natriuretic Peptide 1,023.0 08/12/2019: Hemoglobin 13.3; Platelets 269 02/28/2020: ALT 20; BUN 16; Creatinine, Ser 1.04; Potassium 4.0; Sodium 136   Lipid Panel    Component Value Date/Time   CHOL 138 02/28/2020 1035   TRIG 88 02/28/2020 1035   HDL 37 (L) 02/28/2020 1035   CHOLHDL 3.7 02/28/2020 1035   VLDL 18 02/28/2020 1035   LDLCALC 83 02/28/2020 1035    Additional studies/ records that were reviewed today include:   Cardiac Catheterization: 07/2019  1st Mrg lesion is 60% stenosed.  Prox LAD to Mid LAD lesion is 100% stenosed.  Post intervention, there is a 0%  residual stenosis.  A stent was successfully placed.   Acute ST segment elevation myocardial infarction secondary to total proximal occlusion of the LAD which arises from a common ostium or separate ostium from the circumflex vessel.  Dominant left circumflex vessel with 60% ostial narrowing of a very proximal marginal vessel.  There is a high diagonal/ramus immediate-like vessel which arises more proximally.  Normal RCA with very small PDA.  LVEDP 25 mmHg  Very difficult but ultimately successful PCI to a totally occluded LAD with ultimate insertion of a 3.0 x 30 mm Resolute Onyx stent postdilated to 3.34 mm with the long occluded segment being reduced to 0% and resumption of brisk TIMI-3 flow in a large LAD system which wraps around the LV apex to supply the distal third of the inferior wall.  RECOMMENDATION: DAPT for minimum of 1 year.  Aggressive lipid-lowering therapy with target LDL less than 70.  Optimal blood pressure control.  A 2D echo Doppler study will be done to assess LV function.   Echocardiogram: 02/29/2020 IMPRESSIONS    1. Left ventricular ejection fraction, by estimation, is 55 to 60%. The  left ventricle has normal function. The left ventricle has no regional  wall motion abnormalities. There is mild left ventricular hypertrophy.  Left ventricular diastolic parameters  are indeterminate.  2. Right ventricular systolic function is normal. The right ventricular  size is normal. Tricuspid regurgitation signal is inadequate for assessing  PA pressure.  3. Left atrial size was upper normal.  4. The mitral valve is grossly normal. Trivial mitral valve  regurgitation.  5. The aortic valve is tricuspid. Aortic valve regurgitation is not  visualized.  6. The inferior vena cava is normal in size with greater than 50%  respiratory variability, suggesting right atrial pressure of 3 mmHg.    Zio Monitor: 03/21/2020 ZIO XT reviewed.  6 days 18 hours analyzed.   Predominant rhythm is sinus with heart rate ranging from 54 bpm up to 113 bpm and average heart rate 73 bpm.  Rare PACs including couplets and triplets were noted, less than 1% total beats.  Rare PVCs including couplets were noted, less than 1% total beats.  There were a few brief episodes of SVT noted, the longest of which lasted for 10 beats.  No sustained arrhythmias or  pauses.   Assessment:    1. Coronary artery disease of native artery of native heart with stable angina pectoris (HCC)   2. History of ischemic cardiomyopathy   3. Palpitations   4. Essential hypertension   5. Medication management   6. Hyperlipidemia LDL goal <70      Plan:   In order of problems listed above:  1. CAD - She is s/p STEMI in 07/2019 with DES to LAD. She does report intermittent episodes of chest pain at rest but denies any exertional symptoms or symptoms resembling her prior angina. Has not had to utilize SL NTG.  - Continue current medication regimen with ASA  daily, Plavix  daily, Atorvastatin  daily and  Toprol-XL 100 mg daily.  2. History of Ischemic Cardiomyopathy - Her EF was previously 25% by echo in 07/2019, normalized to 55-60% by repeat echo in 02/2020. She denies any recent orthopnea, PND or lower extremity edema. Appears euvolemic on examination today. Remains on Toprol-XL along with Spironolactone. Will transition Losartan to Lisinopril as outlined below.  3. Palpitations - Recent Zio patch showed rare PAC's and PVC's which were less than 1% of total beats along with brief episods of SVT with the longest being 10 beats. Reports symptoms have improved. Continue Toprol-XL 100 mg daily.  4. HTN - BP was elevated at 150/98 during today's visit and she reports similar readings when checked at home. Will plan to stop Losartan and transition this to Lisinopril 20 mg daily given her reported side effects following recent dose adjustment as outlined above. Repeat BMET in 2 weeks. She was  provided with a BP log and encouraged to return this or call with readings in several weeks. If intolerant to this, will consider the addition of Amlodipine given that her EF has now normalized. Continue Toprol-XL 100 mg daily and Spironolactone 25 mg daily  5. HLD - FLP last month showed that her total cholesterol was at 138 and her LDL had declined to 83 (previously 161 in 07/2019).  Continue current regimen for now with Atorvastatin 40 mg daily.   Medication Adjustments/Labs and Tests Ordered: Current medicines are reviewed at length with the patient today.  Concerns regarding medicines are outlined above.  Medication changes, Labs and Tests ordered today are listed in the Patient Instructions below. Patient Instructions  Medication Instructions:  STOP Losartan   START Lisinopril 20 mg daily   *If you need a refill on your cardiac medications before your next appointment, please call your pharmacy*   Lab Work: BMET in 2 weeks    If you have labs (blood work) drawn today and your tests are completely normal, you will receive your results only by: Marland Kitchen MyChart Message (if you have MyChart) OR . A paper copy in the mail If you have any lab test that is abnormal or we need to change your treatment, we will call you to review the results.   Testing/Procedures: None today   Follow-Up: At [facility], you and your health needs are our priority.  As part of our continuing mission to provide you with exceptional heart care, we have created designated Provider Care Teams.  These Care Teams include your primary Cardiologist (physician) and Advanced Practice Providers (APPs -  Physician Assistants and Nurse Practitioners) who all work together to provide you with the care you need, when you need it.  We recommend signing up for the patient portal called "MyChart".  Sign up information is provided on this After Visit Summary.  MyChart is used to connect with patients for Virtual Visits  (Telemedicine).  Patients are able to view lab/test results, encounter notes, upcoming appointments, etc.  Non-urgent messages can be sent to your provider as well.   To learn more about what you can do with MyChart, go to [email or web address].    Your next appointment:  Keep apt in May with Dr.McDowell      Thank you for choosing [facility] !           Signed, Ellsworth LennoxBrittany M Burnice Oestreicher, PA-C  04/09/2020 4:35 PM    [facility] [address] [place], [place] [address] Phone: [phone] Fax: [phone]

## 2020-04-09 NOTE — Telephone Encounter (Signed)
Patient to be seen in office today by B.Iran Ouch, PA-C

## 2020-04-11 ENCOUNTER — Telehealth: Payer: Self-pay | Admitting: *Deleted

## 2020-04-11 NOTE — Telephone Encounter (Signed)
Received a call from [facility]. Pt called them out because of nausea. BP was checked and noted to be 174/100. Pt reported that last dose of Losartan was yesterday morning. EMS questioned if she may take Lisinopril at this time. I consulted with Randall An, PA-C and told she may. EMS encouraged to call PCP office regarding medication for nausea.

## 2020-04-16 ENCOUNTER — Other Ambulatory Visit: Payer: Self-pay

## 2020-04-16 ENCOUNTER — Telehealth: Payer: Self-pay | Admitting: Cardiology

## 2020-04-16 MED ORDER — METOPROLOL SUCCINATE ER 100 MG PO TB24: 100.0000 mg | ORAL_TABLET | Freq: Every day | ORAL | 3 refills | Status: DC

## 2020-04-16 NOTE — Telephone Encounter (Signed)
Medication refill for Metoprolol Succinate 100 mg 24 hr tablet.

## 2020-04-16 NOTE — Telephone Encounter (Signed)
Refilled toprol 100 mg qd to [facility]

## 2020-04-16 NOTE — Telephone Encounter (Signed)
New message     *STAT* If patient is at the pharmacy, call can be transferred to refill team.   1. Which medications need to be refilled? (please list name of each medication and dose if known) metoprolol succinate (TOPROL-XL) 100 MG 24 hr tablet  2. Which pharmacy/location (including street and city if local pharmacy) is medication to be sent to? [facility] in [place]   3. Do they need a 30 day or 90 day supply? 90

## 2020-05-01 ENCOUNTER — Telehealth: Payer: Self-pay | Admitting: *Deleted

## 2020-05-01 NOTE — Telephone Encounter (Signed)
Request return call back from nurse to discuss symptoms she think one of her medication may be causing.  Reports intermittent Dizziness, headache, chest pain, sob. Denies active chest pain.

## 2020-05-01 NOTE — Telephone Encounter (Signed)
Spoke with pt who states that she feel her Spironolactone is causing he to be nausea, dizzy, headache and she has rash on her mouth. Pt states that sx started on Sunday. She denies chest pain at this time. Please advise.

## 2020-05-01 NOTE — Telephone Encounter (Signed)
Returned call to pt. No answer, voicemail not set up.

## 2020-05-02 MED ORDER — SPIRONOLACTONE 25 MG PO TABS: 25.0000 mg | ORAL_TABLET | Freq: Every day | ORAL | 6 refills | Status: DC

## 2020-05-02 NOTE — Telephone Encounter (Signed)
I spoke with patient. She will Hold Spironolactone for now, have f/u visit on 05/21/20 at 1 pm with R.Barrett, PA-C    She will continue to monitor BP and bring log with her at visit.

## 2020-05-02 NOTE — Telephone Encounter (Signed)
Thank you for the update.  I reviewed the chart.  She has had a number of potential side effect concerns, and it has not been clear exactly which medication it is related to.  If she feels that it could potentially be the spironolactone, I would hold it for now and see if the symptoms resolve.  She will probably need other medication adjustments however to control her blood pressure.  Please make sure that she has a follow-up visit scheduled with APP for review of her blood pressure and medications.

## 2020-05-20 NOTE — Progress Notes (Deleted)
Cardiology Office Note   Date:  05/20/2020   ID:  Gloria Thompson, DOB 1955-07-20, MRN [id number]  PCP:  Assunta Found, MD Cardiologist:  Gloria Dell, MD 03/04/2020 Electrphysiologist: None Theodore Demark, PA-C   No chief complaint on file.   History of Present Illness: Gloria Thompson is a 65 y.o. female with a history of CAD (s/p STEMI in 07/2019 with DES to LAD), chronic combined systolic and diastolic CHF/ICM (EF 25% by echo in 07/2019, normalized to 55-60% by repeat echo in 02/2020), HTN (difficult to control), HLD, palpitations w/ PACs and PVCs on Zio.  Roosvelt Maser presents for ***  COVID status: un-vaccinated, had/did not have COVID Past Medical History:  Diagnosis Date  . Acid reflux   . CAD (coronary artery disease)    a. LHC 08/10/19: 100% of prox to mid LAD s/p DES, 60% of 1st Mrg  . Essential hypertension   . History of cardiomyopathy    Normalization of LVEF 02/2020  . Hyperlipidemia     Past Surgical History:  Procedure Laterality Date  . APPENDECTOMY    . CESAREAN SECTION  [id number]  . CORONARY/GRAFT ACUTE MI REVASCULARIZATION N/A 08/10/2019   Procedure: Coronary/Graft Acute MI Revascularization;  Surgeon: Lennette Bihari, MD;  Location: [facility] INVASIVE CV LAB;  Service: Cardiovascular;  Laterality: N/A;  . kidney stones    . LEFT HEART CATH AND CORONARY ANGIOGRAPHY N/A 08/10/2019   Procedure: LEFT HEART CATH AND CORONARY ANGIOGRAPHY;  Surgeon: Lennette Bihari, MD;  Location: [facility] INVASIVE CV LAB;  Service: Cardiovascular;  Laterality: N/A;    Current Outpatient Medications  Medication Sig Dispense Refill  . aspirin EC 81 MG tablet Take 1 tablet (81 mg total) by mouth daily. 30 tablet 6  . atorvastatin (LIPITOR) 80 MG tablet Take 0.5 tablets (40 mg total) by mouth daily. 30 tablet 6  . clopidogrel (PLAVIX) 75 MG tablet Take 1 tablet (75 mg total) by mouth daily. 90 tablet 3  . esomeprazole (NEXIUM) 20 MG capsule Take 20 mg by mouth daily before  breakfast.    . lisinopril (ZESTRIL) 20 MG tablet Take 1 tablet (20 mg total) by mouth daily. 90 tablet 3  . metoprolol succinate (TOPROL-XL) 100 MG 24 hr tablet Take 1 tablet (100 mg total) by mouth daily. Take with or immediately following a meal. 90 tablet 3  . nitroGLYCERIN (NITROSTAT) 0.4 MG SL tablet Place 1 tablet (0.4 mg total) under the tongue every 5 (five) minutes as needed. (Patient taking differently: Place 0.4 mg under the tongue every 5 (five) minutes x 3 doses as needed for chest pain (if no relief after 2nd dose, proceed to the ED for an evaluation or call [phone]).) 25 tablet 12  . spironolactone (ALDACTONE) 25 MG tablet Take 1 tablet (25 mg total) by mouth daily. 30 tablet 6   No current facility-administered medications for this visit.    Allergies:   Other and Codeine    Social History:  The patient  reports that she has never smoked. She has never used smokeless tobacco. She reports that she does not drink alcohol and does not use drugs.   Family History:  The patient's family history includes COPD in her sister; Diabetes in her sister; Heart attack in her father; Heart disease in her brother, brother, brother, brother, and mother; Hyperlipidemia in her sister and sister; Hypertension in her brother, brother, brother, brother, sister, and sister.  She indicated that her mother is deceased. She indicated that  her father is deceased. She indicated that two of her three sisters are alive. She indicated that all of her five brothers are alive. She indicated that her maternal grandmother is deceased. She indicated that her maternal grandfather is deceased. She indicated that her paternal grandmother is deceased. She indicated that her paternal grandfather is deceased. She indicated that her daughter is alive. She indicated that both of her sons are alive.    ROS:  Please see the history of present illness. All other systems are reviewed and negative.    PHYSICAL EXAM: VS:  There  were no vitals taken for this visit. , BMI There is no height or weight on file to calculate BMI. GEN: Well nourished, well developed, female in no acute distress HEENT: normal for age  Neck: no JVD, no carotid bruit, no masses Cardiac: RRR; no murmur, no rubs, or gallops Respiratory:  clear to auscultation bilaterally, normal work of breathing GI: soft, nontender, nondistended, + BS MS: no deformity or atrophy; no edema; distal pulses are 2+ in all 4 extremities  Skin: warm and dry, no rash Neuro:  Strength and sensation are intact Psych: euthymic mood, full affect   EKG:  EKG {ACTION; IS/IS ZOX:[id number]} ordered today. The ekg ordered today demonstrates ***  ECHO: 02/29/2020 1. Left ventricular ejection fraction, by estimation, is 55 to 60%. The  left ventricle has normal function. The left ventricle has no regional  wall motion abnormalities. There is mild left ventricular hypertrophy.  Left ventricular diastolic parameters  are indeterminate.  2. Right ventricular systolic function is normal. The right ventricular  size is normal. Tricuspid regurgitation signal is inadequate for assessing  PA pressure.  3. Left atrial size was upper normal.  4. The mitral valve is grossly normal. Trivial mitral valve  regurgitation.  5. The aortic valve is tricuspid. Aortic valve regurgitation is not  visualized.  6. The inferior vena cava is normal in size with greater than 50%  respiratory variability, suggesting right atrial pressure of 3 mmHg.  CATH: 08/10/2019   1st Mrg lesion is 60% stenosed.  Prox LAD to Mid LAD lesion is 100% stenosed.  Post intervention, there is a 0% residual stenosis.  A stent was successfully placed.   Acute ST segment elevation myocardial infarction secondary to total proximal occlusion of the LAD which arises from a common ostium or separate ostium from the circumflex vessel.  Dominant left circumflex vessel with 60% ostial narrowing of a very  proximal marginal vessel.  There is a high diagonal/ramus immediate-like vessel which arises more proximally.  Normal RCA with very small PDA.  LVEDP 25 mmHg  Very difficult but ultimately successful PCI to a totally occluded LAD with ultimate insertion of a 3.0 x 30 mm Resolute Onyx stent postdilated to 3.34 mm with the long occluded segment being reduced to 0% and resumption of brisk TIMI-3 flow in a large LAD system which wraps around the LV apex to supply the distal third of the inferior wall.  RECOMMENDATION: DAPT for minimum of 1 year.  Aggressive lipid-lowering therapy with target LDL less than 70.  Optimal blood pressure control.  A 2D echo Doppler study will be done to assess LV function.   MONITOR: 03/21/2020 ZIO XT reviewed.  6 days 18 hours analyzed.  Predominant rhythm is sinus with heart rate ranging from 54 bpm up to 113 bpm and average heart rate 73 bpm.  Rare PACs including couplets and triplets were noted, less than 1% total beats.  Rare  PVCs including couplets were noted, less than 1% total beats.  There were a few brief episodes of SVT noted, the longest of which lasted for 10 beats.  No sustained arrhythmias or pauses.    Recent Labs: 08/11/2019: B Natriuretic Peptide 1,023.0 08/12/2019: Hemoglobin 13.3; Platelets 269 02/28/2020: ALT 20; BUN 16; Creatinine, Ser 1.04; Potassium 4.0; Sodium 136  CBC    Component Value Date/Time   WBC 9.0 08/12/2019 0419   RBC 4.23 08/12/2019 0419   HGB 13.3 08/12/2019 0419   HCT 39.4 08/12/2019 0419   PLT 269 08/12/2019 0419   MCV 93.1 08/12/2019 0419   MCH 31.4 08/12/2019 0419   MCHC 33.8 08/12/2019 0419   RDW 12.2 08/12/2019 0419   LYMPHSABS 1.7 08/12/2019 0419   MONOABS 1.0 08/12/2019 0419   EOSABS 0.1 08/12/2019 0419   BASOSABS 0.0 08/12/2019 0419   CMP Latest Ref Rng & Units 02/28/2020 08/24/2019 08/12/2019  Glucose 70 - 99 mg/dL 588(F) 89 027(X)  BUN 8 - 23 mg/dL 16 11 15   Creatinine 0.44 - 1.00 mg/dL ) 4.12(I)  7.86(V)  Sodium 135 - 145 mmol/L 136 141 140  Potassium 3.5 - 5.1 mmol/L 4.0 4.9 4.1  Chloride 98 - 111 mmol/L 103 106 104  CO2 22 - 32 mmol/L 24 22 27   Calcium 8.9 - 10.3 mg/dL 9.0 6.72(C )  Total Protein 6.5 - 8.1 g/dL 7.4 - -  Total Bilirubin 0.3 - 1.2 mg/dL 0.7 - -  Alkaline Phos 38 - 126 U/L 86 - -  AST 15 - 41 U/L 21 - -  ALT 0 - 44 U/L 20 - -     Lipid Panel Lab Results  Component Value Date   CHOL 138 02/28/2020   HDL 37 (L) 02/28/2020   LDLCALC 83 02/28/2020   TRIG 88 02/28/2020   CHOLHDL 3.7 02/28/2020      Wt Readings from Last 3 Encounters:  04/09/20 190 lb 6.4 oz (86.4 kg)  03/04/20 188 lb 12.8 oz (85.6 kg)  02/07/20 175 lb (79.4 kg)     Other studies Reviewed: Additional studies/ records that were reviewed today include: Office notes, hospital records and testing.  ASSESSMENT AND PLAN:  1.  ***   Current medicines are reviewed at length with the patient today.  The patient {ACTIONS; HAS/DOES NOT HAVE:19233} concerns regarding medicines.  The following changes have been made:  {PLAN; NO CHANGE:13088:s}  Labs/ tests ordered today include:  No orders of the defined types were placed in this encounter.    Disposition:   FU with 03/06/20, MD  Signed, 02/09/20, PA-C  05/20/2020 8:02 PM    [facility] Phone: [phone]; Fax: [phone]

## 2020-05-21 ENCOUNTER — Ambulatory Visit: Payer: PPO | Admitting: Physician Assistant

## 2020-05-27 ENCOUNTER — Encounter: Payer: Self-pay | Admitting: Physician Assistant

## 2020-05-27 ENCOUNTER — Other Ambulatory Visit: Payer: Self-pay

## 2020-05-27 ENCOUNTER — Ambulatory Visit (INDEPENDENT_AMBULATORY_CARE_PROVIDER_SITE_OTHER): Payer: PPO | Admitting: Physician Assistant

## 2020-05-27 ENCOUNTER — Other Ambulatory Visit: Payer: Self-pay | Admitting: Physician Assistant

## 2020-05-27 VITALS — BP 154/86 | HR 72 | Ht 64.0 in | Wt 187.0 lb

## 2020-05-27 DIAGNOSIS — I10 Essential (primary) hypertension: Secondary | ICD-10-CM

## 2020-05-27 DIAGNOSIS — I5042 Chronic combined systolic (congestive) and diastolic (congestive) heart failure: Secondary | ICD-10-CM

## 2020-05-27 DIAGNOSIS — E785 Hyperlipidemia, unspecified: Secondary | ICD-10-CM

## 2020-05-27 DIAGNOSIS — I2510 Atherosclerotic heart disease of native coronary artery without angina pectoris: Secondary | ICD-10-CM | POA: Diagnosis not present

## 2020-05-27 DIAGNOSIS — Z79899 Other long term (current) drug therapy: Secondary | ICD-10-CM | POA: Diagnosis not present

## 2020-05-27 MED ORDER — LISINOPRIL 40 MG PO TABS: 40.0000 mg | ORAL_TABLET | Freq: Every day | ORAL | 3 refills | Status: DC

## 2020-05-27 NOTE — Patient Instructions (Signed)
Medication Instructions:  Your physician has recommended you make the following change in your medication:   Stop Taking Spironolactone  Increase Lisinopril to 40 mg Daily at night  Continue Toprol XL at current dose in the morning   *If you need a refill on your cardiac medications before your next appointment, please call your pharmacy*   Lab Work: Your physician recommends that you return for lab work in: 1 Week   If you have labs (blood work) drawn today and your tests are completely normal, you will receive your results only by: Marland Kitchen MyChart Message (if you have MyChart) OR . A paper copy in the mail If you have any lab test that is abnormal or we need to change your treatment, we will call you to review the results.   Testing/Procedures: NONE   Follow-Up: At [facility], you and your health needs are our priority.  As part of our continuing mission to provide you with exceptional heart care, we have created designated Provider Care Teams.  These Care Teams include your primary Cardiologist (physician) and Advanced Practice Providers (APPs -  Physician Assistants and Nurse Practitioners) who all work together to provide you with the care you need, when you need it.  We recommend signing up for the patient portal called "MyChart".  Sign up information is provided on this After Visit Summary.  MyChart is used to connect with patients for Virtual Visits (Telemedicine).  Patients are able to view lab/test results, encounter notes, upcoming appointments, etc.  Non-urgent messages can be sent to your provider as well.   To learn more about what you can do with MyChart, go to [email or web address].    Your next appointment:   1 month(s)  The format for your next appointment:   In Person  Provider:      Other Instructions Thank you for choosing [facility]!

## 2020-05-27 NOTE — Progress Notes (Signed)
Cardiology Office Note:    Date:  05/29/2020   ID:  Gloria Thompson, DOB 1955-06-21, MRN [id number]  PCP:  Assunta Found, MD  [facility] Cardiologist:  Nona Dell, MD  [facility] Electrophysiologist:  None   Referring MD: Assunta Found, MD   Chief Complaint  Patient presents with  . Follow-up    Seen for Dr. Diona Browner    History of Present Illness:    Gloria Thompson is a 65 y.o. female with a hx of CAD, chronic combined systolic and diastolic heart failure with improved EF, hypertension and hyperlipidemia.  Patient suffered a anterior STEMI on 08/10/2019.  Emergent cardiac catheterization at the time revealed a 60% OM1 lesion, 100% proximal to mid LAD occlusion treated with a 3.0 x 30 mm Resolute Onyx DES postdilated to 3.34 mm.  Echocardiogram obtained on 08/10/2019 showed EF 25%, akinesis of the apical inferior and apical lateral wall, akinesis of the true apex.  Fortunately, with medical therapy, EF has improved to 55 to 60% by echocardiogram on 02/29/2020.  She was seen in November 2021 by Dr. Diona Browner for intermittent palpitation.  A 7-day ZIO monitor demonstrated predominantly sinus rhythm with average heart rate of 73 bpm, rare PACs and rare PVCs which accounts for less than 1% of the total beats, brief episodes of SVT with the longest being 10 beats.  She was continued on Toprol-XL.  Following the recent adjustment of her losartan, she developed a worsening headache along with nausea and the dizziness.  Symptom persisted despite she divide the losartan to 25 mg twice a day.  She was seen by Randall An, PA-C on 04/09/2020 who transitioned her losartan to lisinopril 20 mg daily.  Patient presents today for follow-up.  She has stopped her spironolactone on January 13, since then, her dizziness and nausea has completely resolved.  Her blood pressure however stays in the 130s to 140s at home, she typically take both of her lisinopril and metoprolol succinate in the  morning.  I recommended increase the lisinopril from 20 mg to 40 mg and move it to nighttime.  This way she will take her metoprolol succinate in the morning and lisinopril at night.  She denies any chest pain, shortness of breath, lower extremity edema, orthopnea or PND.  She can follow-up in 1 month.  Medication intolerance (?): Spironolactone (dizziness and nausea), losartan (dizziness and nausea) --> switched to lisinopril  Past Medical History:  Diagnosis Date  . Acid reflux   . CAD (coronary artery disease)    a. LHC 08/10/19: 100% of prox to mid LAD s/p DES, 60% of 1st Mrg  . Essential hypertension   . History of cardiomyopathy    Normalization of LVEF 02/2020  . Hyperlipidemia     Past Surgical History:  Procedure Laterality Date  . APPENDECTOMY    . CESAREAN SECTION  [id number]  . CORONARY/GRAFT ACUTE MI REVASCULARIZATION N/A 08/10/2019   Procedure: Coronary/Graft Acute MI Revascularization;  Surgeon: Lennette Bihari, MD;  Location: [facility] INVASIVE CV LAB;  Service: Cardiovascular;  Laterality: N/A;  . kidney stones    . LEFT HEART CATH AND CORONARY ANGIOGRAPHY N/A 08/10/2019   Procedure: LEFT HEART CATH AND CORONARY ANGIOGRAPHY;  Surgeon: Lennette Bihari, MD;  Location: [facility] INVASIVE CV LAB;  Service: Cardiovascular;  Laterality: N/A;    Current Medications: Current Meds  Medication Sig  . aspirin EC 81 MG tablet Take 1 tablet (81 mg total) by mouth daily.  Marland Kitchen atorvastatin (LIPITOR) 80 MG tablet  Take 0.5 tablets (40 mg total) by mouth daily.  . clopidogrel (PLAVIX) 75 MG tablet Take 1 tablet (75 mg total) by mouth daily.  Marland Kitchen esomeprazole (NEXIUM) 20 MG capsule Take 20 mg by mouth daily before breakfast.  . lisinopril (ZESTRIL) 40 MG tablet Take 1 tablet (40 mg total) by mouth daily.  . metoprolol succinate (TOPROL-XL) 100 MG 24 hr tablet Take 1 tablet (100 mg total) by mouth daily. Take with or immediately following a meal.  . nitroGLYCERIN (NITROSTAT) 0.4 MG SL tablet Place 1 tablet  (0.4 mg total) under the tongue every 5 (five) minutes as needed. (Patient taking differently: Place 0.4 mg under the tongue every 5 (five) minutes x 3 doses as needed for chest pain (if no relief after 2nd dose, proceed to the ED for an evaluation or call [phone]).)  . [DISCONTINUED] lisinopril (ZESTRIL) 20 MG tablet Take 1 tablet (20 mg total) by mouth daily.  . [DISCONTINUED] spironolactone (ALDACTONE) 25 MG tablet Take 1 tablet (25 mg total) by mouth daily.     Allergies:   Other and Codeine   Social History   Socioeconomic History  . Marital status: Widowed    Spouse name: Not on file  . Number of children: Not on file  . Years of education: Not on file  . Highest education level: Not on file  Occupational History  . Not on file  Tobacco Use  . Smoking status: Never Smoker  . Smokeless tobacco: Never Used  Vaping Use  . Vaping Use: Never used  Substance and Sexual Activity  . Alcohol use: No  . Drug use: No  . Sexual activity: Not on file  Other Topics Concern  . Not on file  Social History Narrative  . Not on file   Social Determinants of Health   Financial Resource Strain: Not on file  Food Insecurity: Not on file  Transportation Needs: Not on file  Physical Activity: Not on file  Stress: Not on file  Social Connections: Not on file     Family History: The patient's family history includes COPD in her sister; Diabetes in her sister; Heart attack in her father; Heart disease in her brother, brother, brother, brother, and mother; Hyperlipidemia in her sister and sister; Hypertension in her brother, brother, brother, brother, sister, and sister.  ROS:   Please see the history of present illness.     All other systems reviewed and are negative.  EKGs/Labs/Other Studies Reviewed:    The following studies were reviewed today:  Echo 02/29/2020 1. Left ventricular ejection fraction, by estimation, is 55 to 60%. The  left ventricle has normal function. The left  ventricle has no regional  wall motion abnormalities. There is mild left ventricular hypertrophy.  Left ventricular diastolic parameters  are indeterminate.  2. Right ventricular systolic function is normal. The right ventricular  size is normal. Tricuspid regurgitation signal is inadequate for assessing  PA pressure.  3. Left atrial size was upper normal.  4. The mitral valve is grossly normal. Trivial mitral valve  regurgitation.  5. The aortic valve is tricuspid. Aortic valve regurgitation is not  visualized.  6. The inferior vena cava is normal in size with greater than 50%  respiratory variability, suggesting right atrial pressure of 3 mmHg.   EKG:  EKG is not ordered today.   Recent Labs: 08/11/2019: B Natriuretic Peptide 1,023.0 08/12/2019: Hemoglobin 13.3; Platelets 269 02/28/2020: ALT 20; BUN 16; Creatinine, Ser 1.04; Potassium 4.0; Sodium 136  Recent Lipid  Panel    Component Value Date/Time   CHOL 138 02/28/2020 1035   TRIG 88 02/28/2020 1035   HDL 37 (L) 02/28/2020 1035   CHOLHDL 3.7 02/28/2020 1035   VLDL 18 02/28/2020 1035   LDLCALC 83 02/28/2020 1035     Risk Assessment/Calculations:       Physical Exam:    VS:  BP (!) 154/86   Pulse 72   Ht 5\' 4"  (1.626 m)   Wt 187 lb (84.8 kg)   SpO2 98%   BMI 32.10 kg/m     Wt Readings from Last 3 Encounters:  05/27/20 187 lb (84.8 kg)  04/09/20 190 lb 6.4 oz (86.4 kg)  03/04/20 188 lb 12.8 oz (85.6 kg)     GEN:  Well nourished, well developed in no acute distress HEENT: Normal NECK: No JVD; No carotid bruits LYMPHATICS: No lymphadenopathy CARDIAC: RRR, no murmurs, rubs, gallops RESPIRATORY:  Clear to auscultation without rales, wheezing or rhonchi  ABDOMEN: Soft, non-tender, non-distended MUSCULOSKELETAL:  No edema; No deformity  SKIN: Warm and dry NEUROLOGIC:  Alert and oriented x 3 PSYCHIATRIC:  Normal affect   ASSESSMENT:    1. Primary hypertension   2. Medication management   3. Coronary  artery disease involving native coronary artery of native heart without angina pectoris   4. Chronic combined systolic and diastolic heart failure (HCC)   5. Hyperlipidemia LDL goal <70    PLAN:    In order of problems listed above:  1. Hypertension: Blood pressure has been elevated.  She was switched from losartan to lisinopril due to dizziness and nausea.  She later attributed the same symptoms to spironolactone as well.  Spironolactone was stopped on 1/13.  Blood pressure is elevated today, I will increase her lisinopril to 40 mg daily and move it to nighttime. This way, she will be on metoprolol succinate in the morning and lisinopril at night.  She will need a basic metabolic panel in 1 week to follow-up on renal function and electrolyte given the increase dose of lisinopril  2. CAD: Denies any recent chest discomfort.  Continue aspirin and Plavix  3. Chronic combined systolic and diastolic heart failure: Fortunately her EF has improved back to normal despite history of significant anterior STEMI.  Euvolemic on physical exam  4. Hyperlipidemia: On Lipitor 40 mg daily.        Medication Adjustments/Labs and Tests Ordered: Current medicines are reviewed at length with the patient today.  Concerns regarding medicines are outlined above.  Orders Placed This Encounter  Procedures  . Basic metabolic panel   Meds ordered this encounter  Medications  . lisinopril (ZESTRIL) 40 MG tablet    Sig: Take 1 tablet (40 mg total) by mouth daily.    Dispense:  90 tablet    Refill:  3    Patient Instructions  Medication Instructions:  Your physician has recommended you make the following change in your medication:   Stop Taking Spironolactone  Increase Lisinopril to 40 mg Daily at night  Continue Toprol XL at current dose in the morning   *If you need a refill on your cardiac medications before your next appointment, please call your pharmacy*   Lab Work: Your physician recommends  that you return for lab work in: 1 Week   If you have labs (blood work) drawn today and your tests are completely normal, you will receive your results only by: Marland Kitchen MyChart Message (if you have MyChart) OR . A paper copy in  the mail If you have any lab test that is abnormal or we need to change your treatment, we will call you to review the results.   Testing/Procedures: NONE   Follow-Up: At [facility], you and your health needs are our priority.  As part of our continuing mission to provide you with exceptional heart care, we have created designated Provider Care Teams.  These Care Teams include your primary Cardiologist (physician) and Advanced Practice Providers (APPs -  Physician Assistants and Nurse Practitioners) who all work together to provide you with the care you need, when you need it.  We recommend signing up for the patient portal called "MyChart".  Sign up information is provided on this After Visit Summary.  MyChart is used to connect with patients for Virtual Visits (Telemedicine).  Patients are able to view lab/test results, encounter notes, upcoming appointments, etc.  Non-urgent messages can be sent to your provider as well.   To learn more about what you can do with MyChart, go to [email or web address].    Your next appointment:   1 month(s)  The format for your next appointment:   In Person  Provider:      Other Instructions Thank you for choosing [facility]!       Ramond Dial, [place]  05/29/2020 10:48 PM    [facility]

## 2020-05-29 ENCOUNTER — Encounter: Payer: Self-pay | Admitting: Physician Assistant

## 2020-05-31 ENCOUNTER — Other Ambulatory Visit: Payer: Self-pay

## 2020-05-31 ENCOUNTER — Emergency Department (HOSPITAL_COMMUNITY)
Admission: EM | Admit: 2020-05-31 | Discharge: 2020-05-31 | Disposition: A | Discharge status: Home / Self Care | Payer: PPO | Attending: Emergency Medicine | Admitting: Emergency Medicine

## 2020-05-31 ENCOUNTER — Encounter (HOSPITAL_COMMUNITY): Payer: Self-pay | Admitting: Emergency Medicine

## 2020-05-31 ENCOUNTER — Telehealth: Payer: Self-pay | Admitting: Cardiology

## 2020-05-31 DIAGNOSIS — T783XXA Angioneurotic edema, initial encounter: Secondary | ICD-10-CM | POA: Diagnosis not present

## 2020-05-31 DIAGNOSIS — I110 Hypertensive heart disease with heart failure: Secondary | ICD-10-CM | POA: Insufficient documentation

## 2020-05-31 DIAGNOSIS — Z7902 Long term (current) use of antithrombotics/antiplatelets: Secondary | ICD-10-CM | POA: Insufficient documentation

## 2020-05-31 DIAGNOSIS — K130 Diseases of lips: Secondary | ICD-10-CM | POA: Diagnosis present

## 2020-05-31 DIAGNOSIS — I2510 Atherosclerotic heart disease of native coronary artery without angina pectoris: Secondary | ICD-10-CM | POA: Diagnosis not present

## 2020-05-31 DIAGNOSIS — Z79899 Other long term (current) drug therapy: Secondary | ICD-10-CM | POA: Diagnosis not present

## 2020-05-31 DIAGNOSIS — I5021 Acute systolic (congestive) heart failure: Secondary | ICD-10-CM | POA: Insufficient documentation

## 2020-05-31 MED ORDER — PREDNISONE 10 MG PO TABS: 40.0000 mg | ORAL_TABLET | Freq: Every day | ORAL | 0 refills | Status: DC

## 2020-05-31 MED ORDER — PREDNISONE 50 MG PO TABS
60.0000 mg | ORAL_TABLET | Freq: Once | ORAL | Status: AC
  Administered 2020-05-31: 60 mg via ORAL
  Filled 2020-05-31: qty 1

## 2020-05-31 MED ORDER — DIPHENHYDRAMINE HCL 25 MG PO TABS: 25.0000 mg | ORAL_TABLET | Freq: Four times a day (QID) | ORAL | 0 refills | Status: DC | PRN

## 2020-05-31 MED ORDER — FAMOTIDINE 20 MG PO TABS
20.0000 mg | ORAL_TABLET | Freq: Once | ORAL | Status: AC
  Administered 2020-05-31: 20 mg via ORAL
  Filled 2020-05-31: qty 1

## 2020-05-31 MED ORDER — FAMOTIDINE 20 MG PO TABS: 20.0000 mg | ORAL_TABLET | Freq: Two times a day (BID) | ORAL | 0 refills | Status: DC

## 2020-05-31 MED ORDER — DIPHENHYDRAMINE HCL 25 MG PO CAPS
25.0000 mg | ORAL_CAPSULE | Freq: Once | ORAL | Status: AC
  Administered 2020-05-31: 25 mg via ORAL
  Filled 2020-05-31: qty 1

## 2020-05-31 NOTE — Telephone Encounter (Signed)
Contacted patient and she stated that she was having a problem with her lips swelling and complaining of SOB. She also said she was itching.. Patient refused to go to the ED and said she would await instructions from her Cardiologist.

## 2020-05-31 NOTE — Telephone Encounter (Signed)
She needs to be seen in the ER in case she is having a serious allergic reaction such as angioedema.  Per review of the chart she was most recently switched from losartan to lisinopril, and this could be a potential allergic reaction.  If she is acutely short of breath she should call EMS.

## 2020-05-31 NOTE — Telephone Encounter (Signed)
Called patient and she said she would go be checked out in the ER. She said her breathing is better.

## 2020-05-31 NOTE — ED Triage Notes (Signed)
Pt reports tongue and lip swelling this morning after taking her lisinopril. Pt denies difficulty breathing or talking at this time.

## 2020-05-31 NOTE — ED Provider Notes (Signed)
[facility] EMERGENCY DEPARTMENT Provider Note   CSN: [id number] Arrival date & time: 05/31/20  8938     History Chief Complaint  Patient presents with  . Allergic Reaction    Gloria Thompson is a 65 y.o. female.  Patient followed by Dr. Diona Browner cardiology.  He manages her blood pressure medicine.  Patient started on lisinopril not too long ago for blood pressure management.  At about 6:00 this morning patient got lower lip swelling.  Kind of a thick feeling in her tongue and some slight difficulty with breathing but no wheezing.  Patient is never had anything like this before.  No new exposures.        Past Medical History:  Diagnosis Date  . Acid reflux   . CAD (coronary artery disease)    a. LHC 08/10/19: 100% of prox to mid LAD s/p DES, 60% of 1st Mrg  . Essential hypertension   . History of cardiomyopathy    Normalization of LVEF 02/2020  . Hyperlipidemia     Patient Active Problem List   Diagnosis Date Noted  . Acute systolic CHF (congestive heart failure) (HCC) 08/12/2019  . Hyperlipidemia 08/12/2019  . CAD (coronary artery disease) 08/12/2019  . Ischemic cardiomyopathy 08/12/2019  . Acute ST elevation myocardial infarction (STEMI) due to occlusion of proximal portion of left anterior descending (LAD) coronary artery (HCC) 08/10/2019  . ST elevation myocardial infarction involving left anterior descending (LAD) coronary artery (HCC)   . Hypertension 05/13/2015    Past Surgical History:  Procedure Laterality Date  . APPENDECTOMY    . CESAREAN SECTION  [id number]  . CORONARY/GRAFT ACUTE MI REVASCULARIZATION N/A 08/10/2019   Procedure: Coronary/Graft Acute MI Revascularization;  Surgeon: Lennette Bihari, MD;  [facility] INVASIVE CV LAB;  Service: Cardiovascular;  Laterality: N/A;  . kidney stones    . LEFT HEART CATH AND CORONARY ANGIOGRAPHY N/A 08/10/2019   Procedure: LEFT HEART CATH AND CORONARY ANGIOGRAPHY;  Surgeon: Lennette Bihari, MD;  Location: [facility]  INVASIVE CV LAB;  Service: Cardiovascular;  Laterality: N/A;     OB History    Gravida  3   Para  3   Term      Preterm      AB      Living  3     SAB      IAB      Ectopic      Multiple      Live Births              Family History  Problem Relation Age of Onset  . Heart disease Mother   . Heart attack Father   . Diabetes Sister   . Heart disease Brother   . Hypertension Brother   . Hypertension Sister   . Hyperlipidemia Sister   . COPD Sister   . Hypertension Sister   . Hyperlipidemia Sister   . Heart disease Brother   . Hypertension Brother   . Hypertension Brother   . Heart disease Brother   . Heart disease Brother   . Hypertension Brother     Social History   Tobacco Use  . Smoking status: Never Smoker  . Smokeless tobacco: Never Used  Vaping Use  . Vaping Use: Never used  Substance Use Topics  . Alcohol use: No  . Drug use: No    Home Medications Prior to Admission medications   Medication Sig Start Date End Date Taking? Authorizing Provider  diphenhydrAMINE (BENADRYL) 25 MG tablet  Take 1 tablet (25 mg total) by mouth every 6 (six) hours as needed for allergies. 05/31/20  Yes Vanetta Mulders, MD  famotidine (PEPCID) 20 MG tablet Take 1 tablet (20 mg total) by mouth 2 (two) times daily. 05/31/20  Yes Vanetta Mulders, MD  predniSONE (DELTASONE) 10 MG tablet Take 4 tablets (40 mg total) by mouth daily. 05/31/20  Yes Vanetta Mulders, MD  aspirin EC 81 MG tablet Take 1 tablet (81 mg total) by mouth daily. 08/11/19   Bhagat, Sharrell Ku, PA  atorvastatin (LIPITOR) 80 MG tablet Take 0.5 tablets (40 mg total) by mouth daily. 08/20/19   Strader, Lennart Pall, PA-C  clopidogrel (PLAVIX) 75 MG tablet Take 1 tablet (75 mg total) by mouth daily. 02/07/20   Kroeger, Ovidio Kin., PA-C  esomeprazole (NEXIUM) 20 MG capsule Take 20 mg by mouth daily before breakfast.    [provider]  lisinopril (ZESTRIL) 40 MG tablet Take 1 tablet (40 mg total) by  mouth daily. 05/27/20 08/25/20  Azalee Course, PA  metoprolol succinate (TOPROL-XL) 100 MG 24 hr tablet Take 1 tablet (100 mg total) by mouth daily. Take with or immediately following a meal. 04/16/20   Jonelle Sidle, MD  nitroGLYCERIN (NITROSTAT) 0.4 MG SL tablet Place 1 tablet (0.4 mg total) under the tongue every 5 (five) minutes as needed. Patient taking differently: Place 0.4 mg under the tongue every 5 (five) minutes x 3 doses as needed for chest pain (if no relief after 2nd dose, proceed to the ED for an evaluation or call [phone]). 08/11/19   Bhagat, Sharrell Ku, PA    Allergies    Other and Codeine  Review of Systems   Review of Systems  Constitutional: Negative for chills and fever.  HENT: Positive for facial swelling. Negative for drooling, rhinorrhea, sore throat, trouble swallowing and voice change.   Eyes: Negative for visual disturbance.  Respiratory: Positive for shortness of breath. Negative for cough and wheezing.   Cardiovascular: Negative for chest pain and leg swelling.  Gastrointestinal: Negative for abdominal pain, diarrhea, nausea and vomiting.  Genitourinary: Negative for dysuria.  Musculoskeletal: Negative for back pain and neck pain.  Skin: Negative for rash.  Neurological: Negative for dizziness, light-headedness and headaches.  Hematological: Does not bruise/bleed easily.  Psychiatric/Behavioral: Negative for confusion.    Physical Exam Updated Vital Signs BP (!) 148/70   Pulse 60   Temp 98.3 F (36.8 C) (Oral)   Resp 18   Ht 1.626 m (5\' 4" )   Wt 81.6 kg   SpO2 95%   BMI 30.90 kg/m   Physical Exam Vitals and nursing note reviewed.  Constitutional:      General: She is not in acute distress.    Appearance: She is well-developed and well-nourished.  HENT:     Head: Normocephalic and atraumatic.     Mouth/Throat:     Mouth: Mucous membranes are moist.     Comments: No visible tongue swelling.  There is swelling to the lower lip.  No swelling to the  upper lip.  Swelling to the lower lip is moderate in nature. Eyes:     Extraocular Movements: Extraocular movements intact.     Conjunctiva/sclera: Conjunctivae normal.     Pupils: Pupils are equal, round, and reactive to light.  Cardiovascular:     Rate and Rhythm: Normal rate and regular rhythm.     Heart sounds: No murmur heard.   Pulmonary:     Effort: Pulmonary effort is normal. No respiratory distress.  Breath sounds: Normal breath sounds. No wheezing.     Comments: No respiratory distress.  No wheezing Abdominal:     Palpations: Abdomen is soft.     Tenderness: There is no abdominal tenderness.  Musculoskeletal:        General: No edema. Normal range of motion.     Cervical back: Normal range of motion and neck supple. No rigidity.  Skin:    General: Skin is warm and dry.     Capillary Refill: Capillary refill takes less than 2 seconds.  Neurological:     General: No focal deficit present.     Mental Status: She is alert and oriented to person, place, and time.     Cranial Nerves: No cranial nerve deficit.     Sensory: No sensory deficit.     Motor: No weakness.  Psychiatric:        Mood and Affect: Mood and affect normal.     ED Results / Procedures / Treatments   Labs (all labs ordered are listed, but only abnormal results are displayed) Labs Reviewed - No data to display  EKG None  Radiology No results found.  Procedures Procedures   CRITICAL CARE Performed by: Vanetta Mulders Total critical care time: 35 minutes Critical care time was exclusive of separately billable procedures and treating other patients. Critical care was necessary to treat or prevent imminent or life-threatening deterioration. Critical care was time spent personally by me on the following activities: development of treatment plan with patient and/or surrogate as well as nursing, discussions with consultants, evaluation of patient's response to treatment, examination of patient,  obtaining history from patient or surrogate, ordering and performing treatments and interventions, ordering and review of laboratory studies, ordering and review of radiographic studies, pulse oximetry and re-evaluation of patient's condition.   Medications Ordered in ED Medications  diphenhydrAMINE (BENADRYL) capsule 25 mg (25 mg Oral Given 05/31/20 1053)  famotidine (PEPCID) tablet 20 mg (20 mg Oral Given 05/31/20 1053)  predniSONE (DELTASONE) tablet 60 mg (60 mg Oral Given 05/31/20 1053)    ED Course  I have reviewed the triage vital signs and the nursing notes.  Pertinent labs & imaging results that were available during my care of the patient were reviewed by me and considered in my medical decision making (see chart for details).    MDM Rules/Calculators/A&P                          Symptoms consistent with angioedema.  No significant tongue swelling no difficulty swallowing here no significant wheezing.  Patient feels as if tongue is thick and also clearly has lower lip swelling.  Patient symptoms most likely secondary to lisinopril.  Nothing else to suspect as a new allergen.  This is most likely a side effect of lisinopril.  Will have patient hold lisinopril follow-up with cardiology to see what adjustments they want to make on her blood pressure medicines.  Patient improved here with p.o. Pepcid Benadryl and prednisone.  Patient feeling better.  Patient states tongue feels normal now.  Lip swelling is gone down.  No breathing difficulties.  We will discharge patient home with Pepcid for 7 days prednisone for 5 days and Benadryl for the next 2 days and then as needed.  We will have her follow-up with cardiology    Final Clinical Impression(s) / ED Diagnoses Final diagnoses:  Angioedema, initial encounter    Rx / DC Orders ED Discharge Orders  Ordered    famotidine (PEPCID) 20 MG tablet  2 times daily        05/31/20 1207    predniSONE (DELTASONE) 10 MG tablet  Daily         05/31/20 1207    diphenhydrAMINE (BENADRYL) 25 MG tablet  Every 6 hours PRN        05/31/20 1207           Vanetta Mulders, MD 05/31/20 1214

## 2020-05-31 NOTE — Discharge Instructions (Addendum)
  Call cardiology for recommendation on new blood pressure medication control.  Stop taking the lisinopril.  Take the prednisone as directed for the next 5 days.  The Pepcid as directed for the next 7 days.  Take the Benadryl for the next 2 days every 6 hours then as needed.  Return for any tongue swelling or difficulty breathing or swallowing.

## 2020-05-31 NOTE — Telephone Encounter (Signed)
New message    Pt c/o medication issue:  1. Name of Medication: lisinopril (ZESTRIL) 40 MG tablet  2. How are you currently taking this medication (dosage and times per day)? 1x a day  3. Are you having a reaction (difficulty breathing--STAT)? Itching , lips are swollen and trouble breathing   4. What is your medication issue?  Having allergic reaction to meds

## 2020-06-05 NOTE — Progress Notes (Signed)
Cardiology Office Note    Date:  06/10/2020   ID:  Desmond, [place] [birth year], MRN [id number]   PCP:  [facility]  Cardiologist:  Nona Dell, MD  Advanced Practice Provider:  No care team member to display Electrophysiologist:  None   :[phone]}   No chief complaint on file.   History of Present Illness:  Gloria Thompson is a 65 y.o. female with history of CAD status post STEMI 08/10/2019 treated with DES to the LAD with residual 60% OM1, ischemic cardiomyopathy ejection fraction 25% on echo 08/10/2019 improved on follow-up echo 02/29/2020 LVEF 55 to 60%, hypertension, chronic combined systolic and diastolic CHF, HLD. SVT on 7 days 0 02/2020 started on Toprol-XL.  Patient saw an APP 05/27/2020 at which time she had stopped spironolactone because of dizziness and nausea which resolved. Losartan had been switched to lisinopril due to headaches and dizziness. It was increased that day to 40 mg daily. Patient then developed angioedema lip swelling and some difficulty breathing was seen in the ED 05/31/2020. She was given Pepcid Benadryl and prednisone.  Patient comes in for f/u. Says her lips to swell at times and has a burning/stinging in her throat-no trouble swallowing.Worse if she drinks water or any liquid. Food doesn't bother her.Never had trouble with this before. No food allergies or new detergents.  Takes benadryl on occasion-usually at night because it bothers her the most at night. Some itching at times. Denies chest pain, dyspnea edema.    Past Medical History:  Diagnosis Date  . Acid reflux   . CAD (coronary artery disease)    a. LHC 08/10/19: 100% of prox to mid LAD s/p DES, 60% of 1st Mrg  . Essential hypertension   . History of cardiomyopathy    Normalization of LVEF 02/2020  . Hyperlipidemia     Past Surgical History:  Procedure Laterality Date  . APPENDECTOMY    . CESAREAN SECTION  [id number]  . CORONARY/GRAFT ACUTE  MI REVASCULARIZATION N/A 08/10/2019   Procedure: Coronary/Graft Acute MI Revascularization;  Surgeon: Lennette Bihari, MD;  Location: [facility] INVASIVE CV LAB;  Service: Cardiovascular;  Laterality: N/A;  . kidney stones    . LEFT HEART CATH AND CORONARY ANGIOGRAPHY N/A 08/10/2019   Procedure: LEFT HEART CATH AND CORONARY ANGIOGRAPHY;  Surgeon: Lennette Bihari, MD;  Location: [facility] INVASIVE CV LAB;  Service: Cardiovascular;  Laterality: N/A;    Current Medications: Current Meds  Medication Sig  . aspirin EC 81 MG tablet Take 1 tablet (81 mg total) by mouth daily.  Marland Kitchen atorvastatin (LIPITOR) 80 MG tablet Take 0.5 tablets (40 mg total) by mouth daily.  . clopidogrel (PLAVIX) 75 MG tablet Take 1 tablet (75 mg total) by mouth daily.  . diphenhydrAMINE (BENADRYL) 25 MG tablet Take 1 tablet (25 mg total) by mouth every 6 (six) hours as needed for allergies.  Marland Kitchen esomeprazole (NEXIUM) 20 MG capsule Take 20 mg by mouth daily before breakfast.  . famotidine (PEPCID) 20 MG tablet Take 1 tablet (20 mg total) by mouth 2 (two) times daily.  . metoprolol succinate (TOPROL-XL) 100 MG 24 hr tablet Take 1 tablet (100 mg total) by mouth daily. Take with or immediately following a meal.  . nitroGLYCERIN (NITROSTAT) 0.4 MG SL tablet Place 1 tablet (0.4 mg total) under the tongue every 5 (five) minutes as needed. (Patient taking differently: Place 0.4 mg under the tongue every 5 (five) minutes x 3 doses as  needed for chest pain (if no relief after 2nd dose, proceed to the ED for an evaluation or call [phone]).)     Allergies:   Lisinopril, Other, and Codeine   Social History   Socioeconomic History  . Marital status: Widowed    Spouse name: Not on file  . Number of children: Not on file  . Years of education: Not on file  . Highest education level: Not on file  Occupational History  . Not on file  Tobacco Use  . Smoking status: Never Smoker  . Smokeless tobacco: Never Used  Vaping Use  . Vaping Use: Never used   Substance and Sexual Activity  . Alcohol use: No  . Drug use: No  . Sexual activity: Not on file  Other Topics Concern  . Not on file  Social History Narrative  . Not on file   Social Determinants of Health   Financial Resource Strain: Not on file  Food Insecurity: Not on file  Transportation Needs: Not on file  Physical Activity: Not on file  Stress: Not on file  Social Connections: Not on file     Family History:  The patient's family history includes COPD in her sister; Diabetes in her sister; Heart attack in her father; Heart disease in her brother, brother, brother, brother, and mother; Hyperlipidemia in her sister and sister; Hypertension in her brother, brother, brother, brother, sister, and sister.   ROS:   Please see the history of present illness.    ROS All other systems reviewed and are negative.   PHYSICAL EXAM:   VS:  BP (!) 148/88   Pulse 76   Ht 5\' 4"  (1.626 m)   Wt 186 lb (84.4 kg)   SpO2 98%   BMI 31.93 kg/m   Physical Exam  GEN: Well nourished, well developed, in no acute distress HEENT: lower lip swollen, throat erythema but looks ok Neck: no JVD, carotid bruits, or masses Cardiac:RRR; no murmurs, rubs, or gallops  Respiratory:  clear to auscultation bilaterally, normal work of breathing GI: soft, nontender, nondistended, + BS Ext: without cyanosis, clubbing, or edema, Good distal pulses bilaterally Neuro:  Alert and Oriented x 3 Psych: euthymic mood, full affect  Wt Readings from Last 3 Encounters:  06/10/20 186 lb (84.4 kg)  05/31/20 180 lb (81.6 kg)  05/27/20 187 lb (84.8 kg)      Studies/Labs Reviewed:   EKG:  EKG is not ordered today.    Recent Labs: 08/11/2019: B Natriuretic Peptide 1,023.0 08/12/2019: Hemoglobin 13.3; Platelets 269 02/28/2020: ALT 20; BUN 16; Creatinine, Ser 1.04; Potassium 4.0; Sodium 136   Lipid Panel    Component Value Date/Time   CHOL 138 02/28/2020 1035   TRIG 88 02/28/2020 1035   HDL 37 (L) 02/28/2020  1035   CHOLHDL 3.7 02/28/2020 1035   VLDL 18 02/28/2020 1035   LDLCALC 83 02/28/2020 1035    Additional studies/ records that were reviewed today include:  Echo 02/29/2020  1. Left ventricular ejection fraction, by estimation, is 55 to 60%. The  left ventricle has normal function. The left ventricle has no regional  wall motion abnormalities. There is mild left ventricular hypertrophy.  Left ventricular diastolic parameters  are indeterminate.   2. Right ventricular systolic function is normal. The right ventricular  size is normal. Tricuspid regurgitation signal is inadequate for assessing  PA pressure.   3. Left atrial size was upper normal.   4. The mitral valve is grossly normal. Trivial mitral valve  regurgitation.   5. The aortic valve is tricuspid. Aortic valve regurgitation is not  visualized.   6. The inferior vena cava is normal in size with greater than 50%  respiratory variability, suggesting right atrial pressure of 3 mmHg.       Risk Assessment/Calculations:         ASSESSMENT:    1. Angioedema, subsequent encounter   2. Essential hypertension   3. Coronary artery disease involving native coronary artery of native heart without angina pectoris   4. SVT (supraventricular tachycardia) (HCC)   5. Other hyperlipidemia   6. Ischemic cardiomyopathy   7. Chronic combined systolic and diastolic CHF (congestive heart failure) (HCC)      PLAN:  In order of problems listed above:  Angioedema thought secondary to lisinopril but this was stopped 10 days ago she was treated with steroids Benadryl and Pepcid.  Patient still has swelling of her lips and scratching burning of her throat that hurts when she swallows liquids.  She is not short of breath.  Will refer to an allergist ASAP.  Hypertension with recent angioedema secondary to lisinopril, nausea and dizziness on spironolactone.  We will hold off on adding another agent at this time with ongoing  angioedema.  CAD status post STEMI 08/10/2019 treated with DES to the LAD with residual 60% OM1 no angina  SVT on beta-blocker  HLD on Lipitor  History of ischemic cardiomyopathy resolved  Chronic systolic and diastolic CHF LVEF improved to 55 to 60% on echo 02/29/2020 heart failure compensated   Shared Decision Making/Informed Consent        Medication Adjustments/Labs and Tests Ordered: Current medicines are reviewed at length with the patient today.  Concerns regarding medicines are outlined above.  Medication changes, Labs and Tests ordered today are listed in the Patient Instructions below. Patient Instructions  Medication Instructions:  Your physician recommends that you continue on your current medications as directed. Please refer to the Current Medication list given to you today.  *If you need a refill on your cardiac medications before your next appointment, please call your pharmacy*   Lab Work: None  If you have labs (blood work) drawn today and your tests are completely normal, you will receive your results only by: Marland Kitchen. MyChart Message (if you have MyChart) OR . A paper copy in the mail If you have any lab test that is abnormal or we need to change your treatment, we will call you to review the results.   Testing/Procedures: None   Follow-Up: At [facility], you and your health needs are our priority.  As part of our continuing mission to provide you with exceptional heart care, we have created designated Provider Care Teams.  These Care Teams include your primary Cardiologist (physician) and Advanced Practice Providers (APPs -  Physician Assistants and Nurse Practitioners) who all work together to provide you with the care you need, when you need it.  We recommend signing up for the patient portal called "MyChart".  Sign up information is provided on this After Visit Summary.  MyChart is used to connect with patients for Virtual Visits (Telemedicine).  Patients  are able to view lab/test results, encounter notes, upcoming appointments, etc.  Non-urgent messages can be sent to your provider as well.   To learn more about what you can do with MyChart, go to [email or web address].    Your next appointment:   2 week(s)  The format for your next appointment:   In Person  Provider:  You may see Nona Dell, MD or one of the following Advanced Practice Providers on your designated Care Team:    Randall An, PA-C   Jacolyn Reedy, [place]     Other Instructions Referral to Allergist.         Signed, Jacolyn Reedy, PA-C  06/10/2020 12:18 PM    [facility] [address] [place], [place], [place]  [address] Phone: [phone]; Fax: [phone]

## 2020-06-10 ENCOUNTER — Encounter: Payer: Self-pay | Admitting: Physician Assistant

## 2020-06-10 ENCOUNTER — Other Ambulatory Visit: Payer: Self-pay

## 2020-06-10 ENCOUNTER — Encounter (INDEPENDENT_AMBULATORY_CARE_PROVIDER_SITE_OTHER): Payer: Self-pay

## 2020-06-10 ENCOUNTER — Ambulatory Visit: Payer: PPO | Admitting: Physician Assistant

## 2020-06-10 VITALS — BP 148/88 | HR 76 | Ht 64.0 in | Wt 186.0 lb

## 2020-06-10 DIAGNOSIS — E7849 Other hyperlipidemia: Secondary | ICD-10-CM | POA: Diagnosis not present

## 2020-06-10 DIAGNOSIS — I471 Supraventricular tachycardia, unspecified: Secondary | ICD-10-CM

## 2020-06-10 DIAGNOSIS — I5042 Chronic combined systolic (congestive) and diastolic (congestive) heart failure: Secondary | ICD-10-CM | POA: Diagnosis not present

## 2020-06-10 DIAGNOSIS — T783XXD Angioneurotic edema, subsequent encounter: Secondary | ICD-10-CM | POA: Diagnosis not present

## 2020-06-10 DIAGNOSIS — I2510 Atherosclerotic heart disease of native coronary artery without angina pectoris: Secondary | ICD-10-CM

## 2020-06-10 DIAGNOSIS — I255 Ischemic cardiomyopathy: Secondary | ICD-10-CM | POA: Diagnosis not present

## 2020-06-10 DIAGNOSIS — I10 Essential (primary) hypertension: Secondary | ICD-10-CM | POA: Diagnosis not present

## 2020-06-10 NOTE — Patient Instructions (Signed)
Medication Instructions:  Your physician recommends that you continue on your current medications as directed. Please refer to the Current Medication list given to you today.  *If you need a refill on your cardiac medications before your next appointment, please call your pharmacy*   Lab Work: None  If you have labs (blood work) drawn today and your tests are completely normal, you will receive your results only by: Marland Kitchen MyChart Message (if you have MyChart) OR . A paper copy in the mail If you have any lab test that is abnormal or we need to change your treatment, we will call you to review the results.   Testing/Procedures: None   Follow-Up: At [facility], you and your health needs are our priority.  As part of our continuing mission to provide you with exceptional heart care, we have created designated Provider Care Teams.  These Care Teams include your primary Cardiologist (physician) and Advanced Practice Providers (APPs -  Physician Assistants and Nurse Practitioners) who all work together to provide you with the care you need, when you need it.  We recommend signing up for the patient portal called "MyChart".  Sign up information is provided on this After Visit Summary.  MyChart is used to connect with patients for Virtual Visits (Telemedicine).  Patients are able to view lab/test results, encounter notes, upcoming appointments, etc.  Non-urgent messages can be sent to your provider as well.   To learn more about what you can do with MyChart, go to [email or web address].    Your next appointment:   2 week(s)  The format for your next appointment:   In Person  Provider:   You may see Nona Dell, MD or one of the following Advanced Practice Providers on your designated Care Team:    Randall An, PA-C   Jacolyn Reedy, PA-C     Other Instructions Referral to Allergist.

## 2020-06-19 DIAGNOSIS — J0100 Acute maxillary sinusitis, unspecified: Secondary | ICD-10-CM | POA: Diagnosis not present

## 2020-06-19 DIAGNOSIS — R059 Cough, unspecified: Secondary | ICD-10-CM | POA: Diagnosis not present

## 2020-06-19 DIAGNOSIS — H10023 Other mucopurulent conjunctivitis, bilateral: Secondary | ICD-10-CM | POA: Diagnosis not present

## 2020-06-19 DIAGNOSIS — J029 Acute pharyngitis, unspecified: Secondary | ICD-10-CM | POA: Diagnosis not present

## 2020-06-26 NOTE — Progress Notes (Deleted)
New Patient Note  RE: Gloria Thompson MRN: [id number] DOB: 1955/05/26 Date of Office Visit: 06/27/2020  Referring provider: No ref. provider found Primary care provider: Pcp, No  Chief Complaint: No chief complaint on file.  History of Present Illness: I had the pleasure of seeing Gloria Thompson for initial evaluation at the [facility] on 06/26/2020. She is a 65 y.o. female, who is referred here by Pcp, No for the evaluation of angioedema.  Rash started about *** ago. Mainly occurs on her ***. Describes them as ***. Individual rashes lasts about ***. No ecchymosis upon resolution. Associated symptoms include: ***. Suspected triggers are ***. Denies any *** fevers, chills, changes in medications, foods, personal care products or recent infections. She has tried the following therapies: *** with *** benefit. Systemic steroids ***. Currently on ***.  Previous work up includes: ***. Previous history of rash/hives: ***. Patient is up to date with the following cancer screening tests: ***.  Assessment and Plan: Gloria Thompson is a 65 y.o. female with: No problem-specific Assessment & Plan notes found for this encounter.  No follow-ups on file.  No orders of the defined types were placed in this encounter.  Lab Orders  No laboratory test(s) ordered today    Other allergy screening: Asthma: {Blank single:19197::"yes","no"} Rhino conjunctivitis: {Blank single:19197::"yes","no"} Food allergy: {Blank single:19197::"yes","no"} Medication allergy: {Blank single:19197::"yes","no"} Hymenoptera allergy: {Blank single:19197::"yes","no"} Urticaria: {Blank single:19197::"yes","no"} Eczema:{Blank single:19197::"yes","no"} History of recurrent infections suggestive of immunodeficency: {Blank single:19197::"yes","no"}  Diagnostics: Spirometry:  Tracings reviewed. Her effort: {Blank single:19197::"Good reproducible efforts.","It was hard to get consistent efforts and there is a  question as to whether this reflects a maximal maneuver.","Poor effort, data can not be interpreted."} FVC: ***L FEV1: ***L, ***% predicted FEV1/FVC ratio: ***% Interpretation: {Blank single:19197::"Spirometry consistent with mild obstructive disease","Spirometry consistent with moderate obstructive disease","Spirometry consistent with severe obstructive disease","Spirometry consistent with possible restrictive disease","Spirometry consistent with mixed obstructive and restrictive disease","Spirometry uninterpretable due to technique","Spirometry consistent with normal pattern","No overt abnormalities noted given today's efforts"}.  Please see scanned spirometry results for details.  Skin Testing: {Blank single:19197::"Select foods","Environmental allergy panel","Environmental allergy panel and select foods","Food allergy panel","None","Deferred due to recent antihistamines use"}. Positive test to: ***. Negative test to: ***.  Results discussed with patient/family.   Past Medical History: Patient Active Problem List   Diagnosis Date Noted  . Acute systolic CHF (congestive heart failure) (HCC) 08/12/2019  . Hyperlipidemia 08/12/2019  . CAD (coronary artery disease) 08/12/2019  . Ischemic cardiomyopathy 08/12/2019  . Acute ST elevation myocardial infarction (STEMI) due to occlusion of proximal portion of left anterior descending (LAD) coronary artery (HCC) 08/10/2019  . ST elevation myocardial infarction involving left anterior descending (LAD) coronary artery (HCC)   . Hypertension 05/13/2015   Past Medical History:  Diagnosis Date  . Acid reflux   . CAD (coronary artery disease)    a. LHC 08/10/19: 100% of prox to mid LAD s/p DES, 60% of 1st Mrg  . Essential hypertension   . History of cardiomyopathy    Normalization of LVEF 02/2020  . Hyperlipidemia    Past Surgical History: Past Surgical History:  Procedure Laterality Date  . APPENDECTOMY    . CESAREAN SECTION  [id number]  .  CORONARY/GRAFT ACUTE MI REVASCULARIZATION N/A 08/10/2019   Procedure: Coronary/Graft Acute MI Revascularization;  Surgeon: Lennette Bihari, MD;  Location: [facility] INVASIVE CV LAB;  Service: Cardiovascular;  Laterality: N/A;  . kidney stones    . LEFT HEART CATH AND CORONARY ANGIOGRAPHY N/A 08/10/2019  Procedure: LEFT HEART CATH AND CORONARY ANGIOGRAPHY;  Surgeon: Lennette Bihari, MD;  Location: [facility] INVASIVE CV LAB;  Service: Cardiovascular;  Laterality: N/A;   Medication List:  Current Outpatient Medications  Medication Sig Dispense Refill  . aspirin EC 81 MG tablet Take 1 tablet (81 mg total) by mouth daily. 30 tablet 6  . atorvastatin (LIPITOR) 80 MG tablet Take 0.5 tablets (40 mg total) by mouth daily. 30 tablet 6  . clopidogrel (PLAVIX) 75 MG tablet Take 1 tablet (75 mg total) by mouth daily. 90 tablet 3  . diphenhydrAMINE (BENADRYL) 25 MG tablet Take 1 tablet (25 mg total) by mouth every 6 (six) hours as needed for allergies. 20 tablet 0  . esomeprazole (NEXIUM) 20 MG capsule Take 20 mg by mouth daily before breakfast.    . famotidine (PEPCID) 20 MG tablet Take 1 tablet (20 mg total) by mouth 2 (two) times daily. 30 tablet 0  . metoprolol succinate (TOPROL-XL) 100 MG 24 hr tablet Take 1 tablet (100 mg total) by mouth daily. Take with or immediately following a meal. 90 tablet 3  . nitroGLYCERIN (NITROSTAT) 0.4 MG SL tablet Place 1 tablet (0.4 mg total) under the tongue every 5 (five) minutes as needed. (Patient taking differently: Place 0.4 mg under the tongue every 5 (five) minutes x 3 doses as needed for chest pain (if no relief after 2nd dose, proceed to the ED for an evaluation or call [phone]).) 25 tablet 12   No current facility-administered medications for this visit.   Allergies: Allergies  Allergen Reactions  . Lisinopril Swelling  . Other     States allergies to multiple antibiotics but NAMES UNKNOWN.  Marland Kitchen Codeine Palpitations   Social History: Social History   Socioeconomic History   . Marital status: Widowed    Spouse name: Not on file  . Number of children: Not on file  . Years of education: Not on file  . Highest education level: Not on file  Occupational History  . Not on file  Tobacco Use  . Smoking status: Never Smoker  . Smokeless tobacco: Never Used  Vaping Use  . Vaping Use: Never used  Substance and Sexual Activity  . Alcohol use: No  . Drug use: No  . Sexual activity: Not on file  Other Topics Concern  . Not on file  Social History Narrative  . Not on file   Social Determinants of Health   Financial Resource Strain: Not on file  Food Insecurity: Not on file  Transportation Needs: Not on file  Physical Activity: Not on file  Stress: Not on file  Social Connections: Not on file   Lives in a ***. Smoking: *** Occupation: ***  Environmental HistorySurveyor, minerals in the house: Copywriter, advertising in the family room: {Blank single:19197::"yes","no"} Carpet in the bedroom: {Blank single:19197::"yes","no"} Heating: {Blank single:19197::"electric","gas","heat pump"} Cooling: {Blank single:19197::"central","window","heat pump"} Pet: {Blank single:19197::"yes ***","no"}  Family History: Family History  Problem Relation Age of Onset  . Heart disease Mother   . Heart attack Father   . Diabetes Sister   . Heart disease Brother   . Hypertension Brother   . Hypertension Sister   . Hyperlipidemia Sister   . COPD Sister   . Hypertension Sister   . Hyperlipidemia Sister   . Heart disease Brother   . Hypertension Brother   . Hypertension Brother   . Heart disease Brother   . Heart disease Brother   . Hypertension Brother    Problem  Relation Asthma                                   *** Eczema                                *** Food allergy                          *** Allergic rhino conjunctivitis     ***  Review of Systems  Constitutional: Negative for appetite change, chills,  fever and unexpected weight change.  HENT: Negative for congestion and rhinorrhea.   Eyes: Negative for itching.  Respiratory: Negative for cough, chest tightness, shortness of breath and wheezing.   Cardiovascular: Negative for chest pain.  Gastrointestinal: Negative for abdominal pain.  Genitourinary: Negative for difficulty urinating.  Skin: Negative for rash.  Neurological: Negative for headaches.   Objective: There were no vitals taken for this visit. There is no height or weight on file to calculate BMI. Physical Exam Vitals and nursing note reviewed.  Constitutional:      Appearance: Normal appearance. She is well-developed.  HENT:     Head: Normocephalic and atraumatic.     Right Ear: External ear normal.     Left Ear: External ear normal.     Nose: Nose normal.     Mouth/Throat:     Mouth: Mucous membranes are moist.     Pharynx: Oropharynx is clear.  Eyes:     Conjunctiva/sclera: Conjunctivae normal.  Cardiovascular:     Rate and Rhythm: Normal rate and regular rhythm.     Heart sounds: Normal heart sounds. No murmur heard. No friction rub. No gallop.   Pulmonary:     Effort: Pulmonary effort is normal.     Breath sounds: Normal breath sounds. No wheezing, rhonchi or rales.  Abdominal:     Palpations: Abdomen is soft.  Musculoskeletal:     Cervical back: Neck supple.  Skin:    General: Skin is warm.     Findings: No rash.  Neurological:     Mental Status: She is alert and oriented to person, place, and time.  Psychiatric:        Behavior: Behavior normal.    The plan was reviewed with the patient/family, and all questions/concerned were addressed.  It was my pleasure to see Keighley today and participate in her care. Please feel free to contact me with any questions or concerns.  Sincerely,  Wyline Mood, DO Allergy & Immunology  [facility] office: [phone] [facility] office: [phone]

## 2020-06-27 ENCOUNTER — Ambulatory Visit: Payer: Self-pay | Admitting: *Deleted

## 2020-06-27 ENCOUNTER — Other Ambulatory Visit: Payer: Self-pay

## 2020-06-27 ENCOUNTER — Emergency Department (HOSPITAL_COMMUNITY): Payer: PPO

## 2020-06-27 ENCOUNTER — Ambulatory Visit: Payer: Self-pay | Admitting: Allergy

## 2020-06-27 ENCOUNTER — Ambulatory Visit: Payer: PPO | Admitting: Cardiology

## 2020-06-27 ENCOUNTER — Emergency Department (HOSPITAL_COMMUNITY)
Admission: EM | Admit: 2020-06-27 | Discharge: 2020-06-27 | Disposition: A | Discharge status: Home / Self Care | Payer: PPO | Attending: Emergency Medicine | Admitting: Emergency Medicine

## 2020-06-27 ENCOUNTER — Encounter (HOSPITAL_COMMUNITY): Payer: Self-pay | Admitting: *Deleted

## 2020-06-27 DIAGNOSIS — I5021 Acute systolic (congestive) heart failure: Secondary | ICD-10-CM | POA: Diagnosis not present

## 2020-06-27 DIAGNOSIS — Z7982 Long term (current) use of aspirin: Secondary | ICD-10-CM | POA: Diagnosis not present

## 2020-06-27 DIAGNOSIS — I2510 Atherosclerotic heart disease of native coronary artery without angina pectoris: Secondary | ICD-10-CM | POA: Diagnosis not present

## 2020-06-27 DIAGNOSIS — Z79899 Other long term (current) drug therapy: Secondary | ICD-10-CM | POA: Insufficient documentation

## 2020-06-27 DIAGNOSIS — I110 Hypertensive heart disease with heart failure: Secondary | ICD-10-CM | POA: Insufficient documentation

## 2020-06-27 DIAGNOSIS — R110 Nausea: Secondary | ICD-10-CM | POA: Diagnosis not present

## 2020-06-27 DIAGNOSIS — R0602 Shortness of breath: Secondary | ICD-10-CM | POA: Insufficient documentation

## 2020-06-27 DIAGNOSIS — R002 Palpitations: Secondary | ICD-10-CM | POA: Insufficient documentation

## 2020-06-27 DIAGNOSIS — Z7902 Long term (current) use of antithrombotics/antiplatelets: Secondary | ICD-10-CM | POA: Insufficient documentation

## 2020-06-27 DIAGNOSIS — R519 Headache, unspecified: Secondary | ICD-10-CM | POA: Diagnosis not present

## 2020-06-27 LAB — BASIC METABOLIC PANEL
Anion gap: 11 (ref 5–15)
BUN: 14 mg/dL (ref 8–23)
CO2: 23 mmol/L (ref 22–32)
Calcium: 9.5 mg/dL (ref 8.9–10.3)
Chloride: 105 mmol/L (ref 98–111)
Creatinine, Ser: 1.01 mg/dL — ABNORMAL HIGH (ref 0.44–1.00)
GFR, Estimated: >60 mL/min (ref >60)
Glucose, Bld: 111 mg/dL — ABNORMAL HIGH (ref 70–99)
Potassium: 3.5 mmol/L (ref 3.5–5.1)
Sodium: 139 mmol/L (ref 135–145)

## 2020-06-27 LAB — CBC
HCT: 42.5 % (ref 36.0–46.0)
Hemoglobin: 14.6 g/dL (ref 12.0–15.0)
MCH: 32 pg (ref 26.0–34.0)
MCHC: 34.4 g/dL (ref 30.0–36.0)
MCV: 93.2 fL (ref 80.0–100.0)
Platelets: 293 10*3/uL (ref 150–400)
RBC: 4.56 MIL/uL (ref 3.87–5.11)
RDW: 11.9 % (ref 11.5–15.5)
WBC: 5.5 10*3/uL (ref 4.0–10.5)
nRBC: 0 % (ref 0.0–0.2)

## 2020-06-27 NOTE — ED Triage Notes (Signed)
Pt c/o her heart feeling "irregular" and "beating off", headache, nausea, SOB and intermittent lower lip swelling that started last night but worsened this morning. Denies vomiting. No lip swelling present at this time.

## 2020-06-27 NOTE — Telephone Encounter (Signed)
Pt called with complaints of irregular heart beat, chest pain, and itching; she thinks she may be having  a possible reaction to her eye drops that were prescribed for pink eye; she was prescribed polymyxin B sulf-trimethoprim (POLYTRIM) 10,000 unit- 1 mg/mL drops on 06/19/20; she started having eye itching and runny nose after the first dose; her symptoms started to worsen on 06/26/20 progressed to eye stinging and pain; this morning her symptoms persist and her lips are swollen; she took Pepcid AC and Benadryl after; she  first dose; recommendations made per nurse triage protocol; she verbalized understanding and will have her son take her to the ED; recommendaitons made per nurse triage protocol; she verbalized understanding.   Reason for Disposition . [1] Widespread hives, itching or facial swelling AND [2] onset < 2 hours of exposure to high-risk allergen (e.g., sting, nuts, 1st dose of antibiotic)  Answer Assessment - Initial Assessment Questions 1. MAIN SYMPTOM: "What is your main symptom?" "How bad is it?"       Lips swollen 2. RESPIRATORY STATUS: "Are you having difficulty breathing?"  (e.g., yes/no, wheezing, unable to complete a sentence)     No problems 3. SWALLOWING: "Can you swallow?" (e.g., yes/no, food, fluid, saliva)      no 4. VASCULAR STATUS: "Are you feeling weak?" If Yes, ask: "Can you stand and walk normally?"  having chest pain and irregular heart beat 5. ONSET: "When did the reaction start?" (Minutes or hours ago)      06/19/20 6. SUBSTANCE: "What are you reacting to?" "When did the contact occur?"     polymyxin B sulf-trimethoprim (POLYTRIM) 10,000 unit- 1 mg/mL Drop  7. PREVIOUS REACTION: "Have you ever reacted to it before?" If Yes, ask: "What happened that time?"      8. EPINEPHRINE: "Do you have an epinephrine autoinjector (e.g., EpiPen)?"    no  Protocols used: ANAPHYLAXIS-A-AH

## 2020-06-27 NOTE — Discharge Instructions (Addendum)
Cardiac monitoring here without any palpitations or any abnormalities.  Electrolytes normal.  Rearrange her appointment with the allergist.  It is fine to take Pepcid twice a day.  Also would recommend adding on the other antihistamine Zyrtec which is available over-the-counter.  He will have to hold these medications before you go to your allergy appointment however.  Follow-up with cardiology as needed.  Return for any new or worse symptoms.

## 2020-06-27 NOTE — ED Provider Notes (Signed)
[facility] EMERGENCY DEPARTMENT Provider Note   CSN: [id number] Arrival date & time: 06/27/20  4098     History Chief Complaint  Patient presents with  . Palpitations    Gloria Thompson is a 65 y.o. female.  Patient seen by me in the past.  Patient had reaction to lisinopril it was believed with angioedema.  Patient saw her cardiology group on February 21.  They have stopped the lisinopril and did not restart it.  Patient has a significant cardiac history stents following a STEMI in April 2021.  History of SVT and patient is on Toprol XL for that.  Patient currently taking Pepcid.  And occasionally Benadryl.  Past medical history known for hypertension chronic combined systolic diastolic congestive heart failure.  Patient presenting with a feeling of irregular and palpitations to her heart.  Headache nausea shortness of breath intermittent lower lip swelling started last night.  No vomiting.  Of the lip swelling has resolved the headache has resolved the nausea has resolved patient overall feels better.  Patient did take Pepcid.  Patient was post to go to her allergist today for allergy testing which cardiology arranged.  But Gloria Thompson is not allowed to be on histamine blockers to have that done.  So that will not occur.        Past Medical History:  Diagnosis Date  . Acid reflux   . CAD (coronary artery disease)    a. LHC 08/10/19: 100% of prox to mid LAD s/p DES, 60% of 1st Mrg  . Essential hypertension   . History of cardiomyopathy    Normalization of LVEF 02/2020  . Hyperlipidemia     Patient Active Problem List   Diagnosis Date Noted  . Acute systolic CHF (congestive heart failure) (HCC) 08/12/2019  . Hyperlipidemia 08/12/2019  . CAD (coronary artery disease) 08/12/2019  . Ischemic cardiomyopathy 08/12/2019  . Acute ST elevation myocardial infarction (STEMI) due to occlusion of proximal portion of left anterior descending (LAD) coronary artery (HCC) 08/10/2019  . ST  elevation myocardial infarction involving left anterior descending (LAD) coronary artery (HCC)   . Hypertension 05/13/2015    Past Surgical History:  Procedure Laterality Date  . APPENDECTOMY    . CESAREAN SECTION  [id number]  . CORONARY/GRAFT ACUTE MI REVASCULARIZATION N/A 08/10/2019   Procedure: Coronary/Graft Acute MI Revascularization;  Surgeon: Lennette Bihari, MD;  Location: [facility] INVASIVE CV LAB;  Service: Cardiovascular;  Laterality: N/A;  . kidney stones    . LEFT HEART CATH AND CORONARY ANGIOGRAPHY N/A 08/10/2019   Procedure: LEFT HEART CATH AND CORONARY ANGIOGRAPHY;  Surgeon: Lennette Bihari, MD;  Location: [facility] INVASIVE CV LAB;  Service: Cardiovascular;  Laterality: N/A;     OB History    Gravida  3   Para  3   Term      Preterm      AB      Living  3     SAB      IAB      Ectopic      Multiple      Live Births              Family History  Problem Relation Age of Onset  . Heart disease Mother   . Heart attack Father   . Diabetes Sister   . Heart disease Brother   . Hypertension Brother   . Hypertension Sister   . Hyperlipidemia Sister   . COPD Sister   . Hypertension Sister   .  Hyperlipidemia Sister   . Heart disease Brother   . Hypertension Brother   . Hypertension Brother   . Heart disease Brother   . Heart disease Brother   . Hypertension Brother     Social History   Tobacco Use  . Smoking status: Never Smoker  . Smokeless tobacco: Never Used  Vaping Use  . Vaping Use: Never used  Substance Use Topics  . Alcohol use: No  . Drug use: No    Home Medications Prior to Admission medications   Medication Sig Start Date End Date Taking? Authorizing Provider  aspirin EC 81 MG tablet Take 1 tablet (81 mg total) by mouth daily. 08/11/19  Yes Bhagat, Bhavinkumar, PA  atorvastatin (LIPITOR) 80 MG tablet Take 0.5 tablets (40 mg total) by mouth daily. 08/20/19  Yes Strader, Lennart Pall, PA-C  clopidogrel (PLAVIX) 75 MG tablet Take 1 tablet (75 mg  total) by mouth daily. 02/07/20  Yes Kroeger, Dot Lanes M., PA-C  diphenhydrAMINE (BENADRYL) 25 MG tablet Take 1 tablet (25 mg total) by mouth every 6 (six) hours as needed for allergies. 05/31/20  Yes Vanetta Mulders, MD  esomeprazole (NEXIUM) 20 MG capsule Take 20 mg by mouth daily before breakfast.   Yes [provider]  famotidine (PEPCID) 20 MG tablet Take 1 tablet (20 mg total) by mouth 2 (two) times daily. 05/31/20  Yes Vanetta Mulders, MD  metoprolol succinate (TOPROL-XL) 100 MG 24 hr tablet Take 1 tablet (100 mg total) by mouth daily. Take with or immediately following a meal. 04/16/20  Yes Jonelle Sidle, MD  nitroGLYCERIN (NITROSTAT) 0.4 MG SL tablet Place 1 tablet (0.4 mg total) under the tongue every 5 (five) minutes as needed. Patient taking differently: Place 0.4 mg under the tongue every 5 (five) minutes x 3 doses as needed for chest pain (if no relief after 2nd dose, proceed to the ED for an evaluation or call [phone]). 08/11/19  Yes Bhagat, Bhavinkumar, PA  trimethoprim-polymyxin b (POLYTRIM) ophthalmic solution Place 1 drop into both eyes 6 (six) times daily. Patient not taking: No sig reported 06/19/20   [provider]    Allergies    Lisinopril, Other, Polytrim [polymyxin b-trimethoprim], and Codeine  Review of Systems   Review of Systems  Constitutional: Negative for chills and fever.  HENT: Negative for rhinorrhea and sore throat.   Eyes: Negative for visual disturbance.  Respiratory: Negative for cough and shortness of breath.   Cardiovascular: Positive for palpitations. Negative for chest pain and leg swelling.  Gastrointestinal: Positive for nausea. Negative for abdominal pain, diarrhea and vomiting.  Genitourinary: Negative for dysuria.  Musculoskeletal: Negative for back pain and neck pain.  Skin: Negative for rash.  Neurological: Positive for headaches. Negative for dizziness and light-headedness.  Hematological: Does not bruise/bleed easily.   Psychiatric/Behavioral: Negative for confusion.    Physical Exam Updated Vital Signs BP (!) 151/89   Pulse 77   Temp 97.9 F (36.6 C) (Oral)   Resp (!) 21   Ht 1.626 m (5\' 4" )   Wt 81.6 kg   SpO2 97%   BMI 30.90 kg/m   Physical Exam Vitals and nursing note reviewed.  Constitutional:      General: Gloria Thompson is not in acute distress.    Appearance: Normal appearance. Gloria Thompson is well-developed.  HENT:     Head: Normocephalic and atraumatic.  Eyes:     Extraocular Movements: Extraocular movements intact.     Conjunctiva/sclera: Conjunctivae normal.     Pupils: Pupils are equal,  round, and reactive to light.  Cardiovascular:     Rate and Rhythm: Normal rate and regular rhythm.     Heart sounds: No murmur heard.   Pulmonary:     Effort: Pulmonary effort is normal. No respiratory distress.     Breath sounds: Normal breath sounds.  Chest:     Chest wall: No tenderness.  Abdominal:     Palpations: Abdomen is soft.     Tenderness: There is no abdominal tenderness.  Musculoskeletal:        General: Normal range of motion.     Cervical back: Normal range of motion and neck supple.  Skin:    General: Skin is warm and dry.     Capillary Refill: Capillary refill takes less than 2 seconds.  Neurological:     General: No focal deficit present.     Mental Status: Gloria Thompson is alert and oriented to person, place, and time.     Cranial Nerves: No cranial nerve deficit.     Sensory: No sensory deficit.     Motor: No weakness.     ED Results / Procedures / Treatments   Labs (all labs ordered are listed, but only abnormal results are displayed) Labs Reviewed  BASIC METABOLIC PANEL - Abnormal; Notable for the following components:      Result Value   Glucose, Bld 111 (*)    Creatinine, Ser 1.01 (*)    All other components within normal limits  CBC    EKG EKG Interpretation  Date/Time:  Thursday June 27 2020 08:34:52 EST Ventricular Rate:  88 PR Interval:    QRS Duration: 74 QT  Interval:  373 QTC Calculation: 452 R Axis:   23 Text Interpretation: Sinus rhythm Low voltage, precordial leads Minimal ST depression, diffuse leads Confirmed by Vanetta Mulders [phone]) on 06/27/2020 8:44:02 AM   Radiology DG Chest Port 1 View  Result Date: 06/27/2020 CLINICAL DATA:  Shortness of breath EXAM: PORTABLE CHEST 1 VIEW COMPARISON:  August 09, 2019 FINDINGS: Lungs are clear. Heart size and pulmonary vascularity are normal. No adenopathy. No bone lesions. IMPRESSION: Lungs clear.  Cardiac silhouette normal. Electronically Signed   By: Bretta Bang III M.D.   On: 06/27/2020 09:34    Procedures Procedures   Medications Ordered in ED Medications - No data to display  ED Course  I have reviewed the triage vital signs and the nursing notes.  Pertinent labs & imaging results that were available during my care of the patient were reviewed by me and considered in my medical decision making (see chart for details).    MDM Rules/Calculators/A&P                          Patient without any evidence of any significant allergic reaction currently.  No wheezing.  Oxygen sats are good.  Upper 90s on room air.  No facial swelling no lip swelling no tongue swelling.  Cardiac monitoring without arrhythmia.  EKG without any acute changes.  Basic labs are normal.  And chest x-ray negative for pneumothorax pneumonia or pulmonary edema.  I think patient can continue Pepcid on a regular basis would consider over-the-counter Zyrtec.  Patient will need to reschedule her allergy appointment and obviously Gloria Thompson will have to hold the Pepcid and Zyrtec prior to the appointment.    Final Clinical Impression(s) / ED Diagnoses Final diagnoses:  Palpitations    Rx / DC Orders ED Discharge Orders    None  Vanetta MuldersZackowski, Berklee Battey, MD 06/27/20 1056

## 2020-07-04 ENCOUNTER — Ambulatory Visit: Payer: PPO | Admitting: Student

## 2020-07-09 ENCOUNTER — Ambulatory Visit: Payer: PPO | Admitting: Physician Assistant

## 2020-08-02 ENCOUNTER — Ambulatory Visit: Payer: PPO | Admitting: Student

## 2020-09-13 ENCOUNTER — Other Ambulatory Visit: Payer: Self-pay

## 2020-09-13 ENCOUNTER — Encounter: Payer: Self-pay | Admitting: Cardiology

## 2020-09-13 ENCOUNTER — Encounter (INDEPENDENT_AMBULATORY_CARE_PROVIDER_SITE_OTHER): Payer: Self-pay

## 2020-09-13 ENCOUNTER — Ambulatory Visit: Payer: PPO | Admitting: Cardiology

## 2020-09-13 VITALS — BP 184/104 | HR 74 | Ht 64.0 in | Wt 187.0 lb

## 2020-09-13 DIAGNOSIS — I10 Essential (primary) hypertension: Secondary | ICD-10-CM

## 2020-09-13 DIAGNOSIS — Z8679 Personal history of other diseases of the circulatory system: Secondary | ICD-10-CM

## 2020-09-13 DIAGNOSIS — I25119 Atherosclerotic heart disease of native coronary artery with unspecified angina pectoris: Secondary | ICD-10-CM

## 2020-09-13 MED ORDER — AMLODIPINE BESYLATE 2.5 MG PO TABS: 2.5000 mg | ORAL_TABLET | Freq: Every day | ORAL | 3 refills | Status: DC

## 2020-09-13 NOTE — Patient Instructions (Signed)
Medication Instructions:  START Amlodipine 2.5 mg daily   *If you need a refill on your cardiac medications before your next appointment, please call your pharmacy*   Lab Work: None today  If you have labs (blood work) drawn today and your tests are completely normal, you will receive your results only by: Marland Kitchen MyChart Message (if you have MyChart) OR . A paper copy in the mail If you have any lab test that is abnormal or we need to change your treatment, we will call you to review the results.   Testing/Procedures: None today   Follow-Up: At [facility], you and your health needs are our priority.  As part of our continuing mission to provide you with exceptional heart care, we have created designated Provider Care Teams.  These Care Teams include your primary Cardiologist (physician) and Advanced Practice Providers (APPs -  Physician Assistants and Nurse Practitioners) who all work together to provide you with the care you need, when you need it.  We recommend signing up for the patient portal called "MyChart".  Sign up information is provided on this After Visit Summary.  MyChart is used to connect with patients for Virtual Visits (Telemedicine).  Patients are able to view lab/test results, encounter notes, upcoming appointments, etc.  Non-urgent messages can be sent to your provider as well.   To learn more about what you can do with MyChart, go to [email or web address].    Your next appointment:   3 month(s)  The format for your next appointment:   In Person  Provider:   Nona Dell, MD or Randall An, PA-C   Other Instructions Nurse apt in 2 weeks for BP check      Thank you for choosing [facility] !

## 2020-09-13 NOTE — Progress Notes (Signed)
Cardiology Office Note  Date: 09/13/2020   ID: Gloria Thompson, DOB 05-12-1955, MRN [id number]  PCP:  Patient, No Pcp Per (Inactive)  Cardiologist:  Rozann Lesches, MD Electrophysiologist:  None   Chief Complaint  Patient presents with  . Cardiac follow-up    History of Present Illness: Gloria Thompson is a 65 y.o. female last seen by Ms. Vita Barley in February.  She presents for a follow-up visit.  I reviewed her chart and medication adjustments since I met her in November of last year.  She experienced nausea and headache related to losartan, nausea and dizziness related to spironolactone, and ultimately angioedema related to lisinopril.  She has stayed on Toprol-XL 100 mg daily and blood pressure is poorly controlled today.  She does not describe any angina symptoms at this time.  She remains on aspirin and Plavix along with Lipitor.  She has not yet established with a PCP.  Past Medical History:  Diagnosis Date  . Acid reflux   . CAD (coronary artery disease)    a. LHC 08/10/19: 100% of prox to mid LAD s/p DES, 60% of 1st Mrg  . Essential hypertension   . History of cardiomyopathy    Normalization of LVEF 02/2020  . Hyperlipidemia     Past Surgical History:  Procedure Laterality Date  . APPENDECTOMY    . CESAREAN SECTION  [id number]  . CORONARY/GRAFT ACUTE MI REVASCULARIZATION N/A 08/10/2019   Procedure: Coronary/Graft Acute MI Revascularization;  Surgeon: Troy Sine, MD;  Location: [facility];  Service: Cardiovascular;  Laterality: N/A;  . kidney stones    . LEFT HEART CATH AND CORONARY ANGIOGRAPHY N/A 08/10/2019   Procedure: LEFT HEART CATH AND CORONARY ANGIOGRAPHY;  Surgeon: Troy Sine, MD;  Location: [facility];  Service: Cardiovascular;  Laterality: N/A;    Current Outpatient Medications  Medication Sig Dispense Refill  . amLODipine (NORVASC) 2.5 MG tablet Take 1 tablet (2.5 mg total) by mouth daily. 90 tablet 3  . aspirin EC 81  MG tablet Take 1 tablet (81 mg total) by mouth daily. 30 tablet 6  . atorvastatin (LIPITOR) 80 MG tablet Take 0.5 tablets (40 mg total) by mouth daily. 30 tablet 6  . clopidogrel (PLAVIX) 75 MG tablet Take 1 tablet (75 mg total) by mouth daily. 90 tablet 3  . diphenhydrAMINE (BENADRYL) 25 MG tablet Take 1 tablet (25 mg total) by mouth every 6 (six) hours as needed for allergies. 20 tablet 0  . esomeprazole (NEXIUM) 20 MG capsule Take 20 mg by mouth daily before breakfast.    . famotidine (PEPCID) 20 MG tablet Take 1 tablet (20 mg total) by mouth 2 (two) times daily. 30 tablet 0  . metoprolol succinate (TOPROL-XL) 100 MG 24 hr tablet Take 1 tablet (100 mg total) by mouth daily. Take with or immediately following a meal. 90 tablet 3  . nitroGLYCERIN (NITROSTAT) 0.4 MG SL tablet Place 1 tablet (0.4 mg total) under the tongue every 5 (five) minutes as needed. (Patient taking differently: Place 0.4 mg under the tongue every 5 (five) minutes x 3 doses as needed for chest pain (if no relief after 2nd dose, proceed to the ED for an evaluation or call [phone]).) 25 tablet 12   No current facility-administered medications for this visit.   Allergies:  Lisinopril, Other, Polytrim [polymyxin b-trimethoprim], and Codeine   ROS: No orthopnea or PND.  Physical Exam: VS:  BP (!) 184/104   Pulse 74  Ht _0  (1.626 m)   Wt 187 lb (84.8 kg)   SpO2 98%   BMI 32.10 kg/m , BMI Body mass index is 32.1 kg/m.  Wt Readings from Last 3 Encounters:  09/13/20 187 lb (84.8 kg)  06/27/20 180 lb (81.6 kg)  06/10/20 186 lb (84.4 kg)    General: Patient appears comfortable at rest. HEENT: Conjunctiva and lids normal, wearing a mask. Neck: Supple, no elevated JVP or carotid bruits, no thyromegaly. Lungs: Clear to auscultation, nonlabored breathing at rest. Cardiac: Regular rate and rhythm, no S3 or significant systolic murmur, no pericardial rub. Extremities: No pitting edema.  ECG:  An ECG dated 06/28/2020 was  personally reviewed today and demonstrated:  Sinus rhythm with decreased R wave progression and nonspecific ST changes.  Recent Labwork: 02/28/2020: ALT 20; AST 21 06/27/2020: BUN 14; Creatinine, Ser 1.01; Hemoglobin 14.6; Platelets 293; Potassium 3.5; Sodium 139     Component Value Date/Time   CHOL 138 02/28/2020 1035   TRIG 88 02/28/2020 1035   HDL 37 (L) 02/28/2020 1035   CHOLHDL 3.7 02/28/2020 1035   VLDL 18 02/28/2020 1035   LDLCALC 83 02/28/2020 1035    Other Studies Reviewed Today:  Echocardiogram 02/29/2020: 1. Left ventricular ejection fraction, by estimation, is 55 to 60%. The  left ventricle has normal function. The left ventricle has no regional  wall motion abnormalities. There is mild left ventricular hypertrophy.  Left ventricular diastolic parameters  are indeterminate.  2. Right ventricular systolic function is normal. The right ventricular  size is normal. Tricuspid regurgitation signal is inadequate for assessing  PA pressure.  3. Left atrial size was upper normal.  4. The mitral valve is grossly normal. Trivial mitral valve  regurgitation.  5. The aortic valve is tricuspid. Aortic valve regurgitation is not  visualized.  6. The inferior vena cava is normal in size with greater than 50%  respiratory variability, suggesting right atrial pressure of 3 mmHg.   Cardiac monitor December 2021: ZIO XT reviewed.  6 days 18 hours analyzed.  Predominant rhythm is sinus with heart rate ranging from 54 bpm up to 113 bpm and average heart rate 73 bpm.  Rare PACs including couplets and triplets were noted, less than 1% total beats.  Rare PVCs including couplets were noted, less than 1% total beats.  There were a few brief episodes of SVT noted, the longest of which lasted for 10 beats.  No sustained arrhythmias or pauses.  Assessment and Plan:  1.  Essential hypertension, blood pressure is not well controlled.  This is complicated by medication intolerances and  allergies.  Continue Toprol-XL 100 mg daily.  Start Norvasc 2.5 mg daily (she recalls tolerating this in the past).  This can be further uptitrated.  Schedule nurse follow-up visit for blood pressure check.  2.  CAD status post DES to the proximal/mid LAD in April 2021.  LVEF 55 to 60% by subsequent echocardiogram.  She does not report any active angina at this time.  Continue aspirin, Plavix, Toprol-XL, and Lipitor.  3.  History of palpitations.  Cardiac monitor demonstrated rare atrial and ventricular ectopy as well as a few brief episodes of SVT.  She is tolerating beta-blocker well.  Medication Adjustments/Labs and Tests Ordered: Current medicines are reviewed at length with the patient today.  Concerns regarding medicines are outlined above.   Tests Ordered: No orders of the defined types were placed in this encounter.   Medication Changes: Meds ordered this encounter  Medications  .  amLODipine (NORVASC) 2.5 MG tablet    Sig: Take 1 tablet (2.5 mg total) by mouth daily.    Dispense:  90 tablet    Refill:  3    Disposition:  Follow up 3 months.  Signed, Satira Sark, MD, [facility] 09/13/2020 2:08 PM    [facility] [address], [place], [place] [address] Phone: [phone]; Fax: [phone]

## 2020-09-19 ENCOUNTER — Emergency Department (HOSPITAL_COMMUNITY): Payer: PPO

## 2020-09-19 ENCOUNTER — Encounter (HOSPITAL_COMMUNITY): Payer: Self-pay | Admitting: Emergency Medicine

## 2020-09-19 ENCOUNTER — Other Ambulatory Visit: Payer: Self-pay

## 2020-09-19 ENCOUNTER — Encounter (HOSPITAL_COMMUNITY)
Admission: EM | Disposition: A | Discharge status: Home / Self Care | Payer: Self-pay | Source: Home / Self Care | Attending: Emergency Medicine

## 2020-09-19 ENCOUNTER — Observation Stay (HOSPITAL_COMMUNITY)
Admission: EM | Admit: 2020-09-19 | Discharge: 2020-09-20 | Disposition: A | Discharge status: Home / Self Care | Payer: PPO | Attending: Cardiology | Admitting: Cardiology

## 2020-09-19 DIAGNOSIS — R079 Chest pain, unspecified: Secondary | ICD-10-CM | POA: Diagnosis not present

## 2020-09-19 DIAGNOSIS — R002 Palpitations: Secondary | ICD-10-CM

## 2020-09-19 DIAGNOSIS — E782 Mixed hyperlipidemia: Secondary | ICD-10-CM | POA: Diagnosis present

## 2020-09-19 DIAGNOSIS — E785 Hyperlipidemia, unspecified: Secondary | ICD-10-CM | POA: Diagnosis not present

## 2020-09-19 DIAGNOSIS — I25110 Atherosclerotic heart disease of native coronary artery with unstable angina pectoris: Principal | ICD-10-CM | POA: Insufficient documentation

## 2020-09-19 DIAGNOSIS — Z7982 Long term (current) use of aspirin: Secondary | ICD-10-CM | POA: Insufficient documentation

## 2020-09-19 DIAGNOSIS — Z20822 Contact with and (suspected) exposure to covid-19: Secondary | ICD-10-CM | POA: Diagnosis not present

## 2020-09-19 DIAGNOSIS — R072 Precordial pain: Secondary | ICD-10-CM | POA: Diagnosis not present

## 2020-09-19 DIAGNOSIS — R0689 Other abnormalities of breathing: Secondary | ICD-10-CM | POA: Diagnosis not present

## 2020-09-19 DIAGNOSIS — I25119 Atherosclerotic heart disease of native coronary artery with unspecified angina pectoris: Secondary | ICD-10-CM

## 2020-09-19 DIAGNOSIS — Z79899 Other long term (current) drug therapy: Secondary | ICD-10-CM | POA: Insufficient documentation

## 2020-09-19 DIAGNOSIS — I200 Unstable angina: Secondary | ICD-10-CM | POA: Diagnosis not present

## 2020-09-19 DIAGNOSIS — I10 Essential (primary) hypertension: Secondary | ICD-10-CM | POA: Insufficient documentation

## 2020-09-19 DIAGNOSIS — K219 Gastro-esophageal reflux disease without esophagitis: Secondary | ICD-10-CM

## 2020-09-19 HISTORY — PX: LEFT HEART CATH AND CORONARY ANGIOGRAPHY: CATH118249

## 2020-09-19 LAB — BASIC METABOLIC PANEL
Anion gap: 5 (ref 5–15)
BUN: 17 mg/dL (ref 8–23)
CO2: 28 mmol/L (ref 22–32)
Calcium: 9 mg/dL (ref 8.9–10.3)
Chloride: 104 mmol/L (ref 98–111)
Creatinine, Ser: 0.97 mg/dL (ref 0.44–1.00)
GFR, Estimated: >60 mL/min (ref >60)
Glucose, Bld: 122 mg/dL — ABNORMAL HIGH (ref 70–99)
Potassium: 3.8 mmol/L (ref 3.5–5.1)
Sodium: 137 mmol/L (ref 135–145)

## 2020-09-19 LAB — RESP PANEL BY RT-PCR (FLU A&B, COVID) ARPGX2
Influenza A by PCR: NEGATIVE
Influenza B by PCR: NEGATIVE
SARS Coronavirus 2 by RT PCR: NEGATIVE

## 2020-09-19 LAB — CBC
HCT: 42.2 % (ref 36.0–46.0)
Hemoglobin: 14.3 g/dL (ref 12.0–15.0)
MCH: 32 pg (ref 26.0–34.0)
MCHC: 33.9 g/dL (ref 30.0–36.0)
MCV: 94.4 fL (ref 80.0–100.0)
Platelets: 266 10*3/uL (ref 150–400)
RBC: 4.47 MIL/uL (ref 3.87–5.11)
RDW: 11.4 % — ABNORMAL LOW (ref 11.5–15.5)
WBC: 7.1 10*3/uL (ref 4.0–10.5)
nRBC: 0 % (ref 0.0–0.2)

## 2020-09-19 LAB — TROPONIN I (HIGH SENSITIVITY)
Troponin I (High Sensitivity): 6 ng/L (ref <18)
Troponin I (High Sensitivity): 5 ng/L (ref <18)

## 2020-09-19 LAB — HIV ANTIBODY (ROUTINE TESTING W REFLEX): HIV Screen 4th Generation wRfx: NONREACTIVE

## 2020-09-19 SURGERY — LEFT HEART CATH AND CORONARY ANGIOGRAPHY
Anesthesia: LOCAL

## 2020-09-19 MED ORDER — ATORVASTATIN CALCIUM 40 MG PO TABS
40.0000 mg | ORAL_TABLET | Freq: Every day | ORAL | Status: DC
  Administered 2020-09-19: 40 mg via ORAL
  Filled 2020-09-19: qty 1

## 2020-09-19 MED ORDER — NITROGLYCERIN 0.4 MG SL SUBL: 0.4000 mg | SUBLINGUAL_TABLET | SUBLINGUAL | Status: DC | PRN

## 2020-09-19 MED ORDER — SODIUM CHLORIDE 0.9 % IV SOLN: 250.0000 mL | INTRAVENOUS | Status: DC | PRN

## 2020-09-19 MED ORDER — ONDANSETRON HCL 4 MG/2ML IJ SOLN: 4.0000 mg | Freq: Four times a day (QID) | INTRAMUSCULAR | Status: DC | PRN

## 2020-09-19 MED ORDER — NITROGLYCERIN IN D5W 200-5 MCG/ML-% IV SOLN
0.0000 ug/min | INTRAVENOUS | Status: DC
  Administered 2020-09-19 (×2): 5 ug/min via INTRAVENOUS
  Administered 2020-09-19: 10 ug/min via INTRAVENOUS
  Filled 2020-09-19: qty 250

## 2020-09-19 MED ORDER — FENTANYL CITRATE (PF) 100 MCG/2ML IJ SOLN
INTRAMUSCULAR | Status: AC
  Filled 2020-09-19: qty 2

## 2020-09-19 MED ORDER — ATORVASTATIN CALCIUM 80 MG PO TABS
80.0000 mg | ORAL_TABLET | Freq: Every day | ORAL | Status: DC
  Administered 2020-09-20: 80 mg via ORAL
  Filled 2020-09-19: qty 1

## 2020-09-19 MED ORDER — ACETAMINOPHEN 325 MG PO TABS
650.0000 mg | ORAL_TABLET | ORAL | Status: DC | PRN
Start: 2020-09-19 — End: 2020-09-19
  Administered 2020-09-19: 650 mg via ORAL
  Filled 2020-09-19: qty 2

## 2020-09-19 MED ORDER — SODIUM CHLORIDE 0.9% FLUSH: 3.0000 mL | Freq: Two times a day (BID) | INTRAVENOUS | Status: DC

## 2020-09-19 MED ORDER — ASPIRIN 81 MG PO CHEW
81.0000 mg | CHEWABLE_TABLET | Freq: Every day | ORAL | Status: DC
  Administered 2020-09-20: 81 mg via ORAL
  Filled 2020-09-19: qty 1

## 2020-09-19 MED ORDER — SODIUM CHLORIDE 0.9 % WEIGHT BASED INFUSION
3.0000 mL/kg/h | INTRAVENOUS | Status: AC
  Administered 2020-09-19: 3 mL/kg/h via INTRAVENOUS

## 2020-09-19 MED ORDER — LIDOCAINE HCL (PF) 1 % IJ SOLN
INTRAMUSCULAR | Status: DC | PRN
  Administered 2020-09-19: 2 mL

## 2020-09-19 MED ORDER — HEPARIN BOLUS VIA INFUSION
4000.0000 [IU] | Freq: Once | INTRAVENOUS | Status: AC
  Administered 2020-09-19: 4000 [IU] via INTRAVENOUS

## 2020-09-19 MED ORDER — HEPARIN (PORCINE) IN NACL 1000-0.9 UT/500ML-% IV SOLN
INTRAVENOUS | Status: DC | PRN
  Administered 2020-09-19: 500 mL

## 2020-09-19 MED ORDER — HEPARIN (PORCINE) 25000 UT/250ML-% IV SOLN
900.0000 [IU]/h | INTRAVENOUS | Status: DC
  Administered 2020-09-19: 900 [IU]/h via INTRAVENOUS
  Filled 2020-09-19: qty 250

## 2020-09-19 MED ORDER — SODIUM CHLORIDE 0.9 % WEIGHT BASED INFUSION: 1.0000 mL/kg/h | INTRAVENOUS | Status: DC

## 2020-09-19 MED ORDER — LABETALOL HCL 5 MG/ML IV SOLN: 10.0000 mg | INTRAVENOUS | Status: AC | PRN

## 2020-09-19 MED ORDER — HYDRALAZINE HCL 20 MG/ML IJ SOLN: 10.0000 mg | INTRAMUSCULAR | Status: AC | PRN

## 2020-09-19 MED ORDER — ASPIRIN EC 81 MG PO TBEC
81.0000 mg | DELAYED_RELEASE_TABLET | Freq: Every day | ORAL | Status: DC
  Administered 2020-09-19: 81 mg via ORAL
  Filled 2020-09-19: qty 1

## 2020-09-19 MED ORDER — SODIUM CHLORIDE 0.9% FLUSH: 3.0000 mL | INTRAVENOUS | Status: DC | PRN

## 2020-09-19 MED ORDER — MIDAZOLAM HCL 2 MG/2ML IJ SOLN
INTRAMUSCULAR | Status: AC
  Filled 2020-09-19: qty 2

## 2020-09-19 MED ORDER — HEPARIN SODIUM (PORCINE) 1000 UNIT/ML IJ SOLN
INTRAMUSCULAR | Status: DC | PRN
  Administered 2020-09-19: 4000 [IU] via INTRAVENOUS

## 2020-09-19 MED ORDER — ATORVASTATIN CALCIUM 40 MG PO TABS
40.0000 mg | ORAL_TABLET | Freq: Once | ORAL | Status: AC
  Administered 2020-09-19: 40 mg via ORAL
  Filled 2020-09-19: qty 1

## 2020-09-19 MED ORDER — PANTOPRAZOLE SODIUM 40 MG PO TBEC
40.0000 mg | DELAYED_RELEASE_TABLET | Freq: Every day | ORAL | Status: DC
  Administered 2020-09-19 – 2020-09-20 (×2): 40 mg via ORAL
  Filled 2020-09-19 (×2): qty 1

## 2020-09-19 MED ORDER — CLOPIDOGREL BISULFATE 75 MG PO TABS
75.0000 mg | ORAL_TABLET | Freq: Every day | ORAL | Status: DC
  Administered 2020-09-20: 75 mg via ORAL
  Filled 2020-09-19: qty 1

## 2020-09-19 MED ORDER — VERAPAMIL HCL 2.5 MG/ML IV SOLN
INTRAVENOUS | Status: AC
  Filled 2020-09-19: qty 2

## 2020-09-19 MED ORDER — FENTANYL CITRATE (PF) 100 MCG/2ML IJ SOLN
50.0000 ug | Freq: Once | INTRAMUSCULAR | Status: AC
  Administered 2020-09-19: 50 ug via INTRAVENOUS
  Filled 2020-09-19: qty 2

## 2020-09-19 MED ORDER — HEPARIN (PORCINE) IN NACL 1000-0.9 UT/500ML-% IV SOLN
INTRAVENOUS | Status: AC
  Filled 2020-09-19: qty 1000

## 2020-09-19 MED ORDER — ASPIRIN 81 MG PO CHEW: 81.0000 mg | CHEWABLE_TABLET | ORAL | Status: DC

## 2020-09-19 MED ORDER — ACETAMINOPHEN 325 MG PO TABS: 650.0000 mg | ORAL_TABLET | ORAL | Status: DC | PRN

## 2020-09-19 MED ORDER — AMLODIPINE BESYLATE 2.5 MG PO TABS
2.5000 mg | ORAL_TABLET | Freq: Every day | ORAL | Status: DC
  Administered 2020-09-19: 2.5 mg via ORAL
  Filled 2020-09-19: qty 1

## 2020-09-19 MED ORDER — IOHEXOL 350 MG/ML SOLN
INTRAVENOUS | Status: DC | PRN
  Administered 2020-09-19: 40 mL

## 2020-09-19 MED ORDER — MIDAZOLAM HCL 2 MG/2ML IJ SOLN
INTRAMUSCULAR | Status: DC | PRN
  Administered 2020-09-19: 1 mg via INTRAVENOUS

## 2020-09-19 MED ORDER — LIDOCAINE HCL (PF) 1 % IJ SOLN
INTRAMUSCULAR | Status: AC
  Filled 2020-09-19: qty 30

## 2020-09-19 MED ORDER — VERAPAMIL HCL 2.5 MG/ML IV SOLN
INTRA_ARTERIAL | Status: DC | PRN
  Administered 2020-09-19: 10 mL via INTRA_ARTERIAL

## 2020-09-19 MED ORDER — FENTANYL CITRATE (PF) 100 MCG/2ML IJ SOLN
INTRAMUSCULAR | Status: DC | PRN
  Administered 2020-09-19: 25 ug via INTRAVENOUS

## 2020-09-19 MED ORDER — SODIUM CHLORIDE 0.9 % IV SOLN: INTRAVENOUS | Status: AC

## 2020-09-19 MED ORDER — HEPARIN SODIUM (PORCINE) 1000 UNIT/ML IJ SOLN
INTRAMUSCULAR | Status: AC
  Filled 2020-09-19: qty 1

## 2020-09-19 MED ORDER — CLOPIDOGREL BISULFATE 75 MG PO TABS
75.0000 mg | ORAL_TABLET | Freq: Every day | ORAL | Status: DC
  Administered 2020-09-19: 75 mg via ORAL
  Filled 2020-09-19: qty 1

## 2020-09-19 MED ORDER — METOPROLOL SUCCINATE ER 100 MG PO TB24
100.0000 mg | ORAL_TABLET | Freq: Every day | ORAL | Status: DC
  Administered 2020-09-19 – 2020-09-20 (×2): 100 mg via ORAL
  Filled 2020-09-19: qty 2
  Filled 2020-09-19: qty 1

## 2020-09-19 MED ORDER — NITROGLYCERIN 1 MG/10 ML FOR IR/CATH LAB
INTRA_ARTERIAL | Status: AC
  Filled 2020-09-19: qty 10

## 2020-09-19 SURGICAL SUPPLY — 11 items
CATH INFINITI 5FR ANG PIGTAIL (CATHETERS) ×2 IMPLANT
CATH OPTITORQUE TIG 4.0 5F (CATHETERS) ×2 IMPLANT
DEVICE RAD COMP TR BAND LRG (VASCULAR PRODUCTS) ×2 IMPLANT
GLIDESHEATH SLEND A-KIT 6F 22G (SHEATH) ×2 IMPLANT
GUIDEWIRE INQWIRE 1.5J.035X260 (WIRE) ×1 IMPLANT
INQWIRE 1.5J .035X260CM (WIRE) ×2
KIT HEART LEFT (KITS) ×2 IMPLANT
PACK CARDIAC CATHETERIZATION (CUSTOM PROCEDURE TRAY) ×2 IMPLANT
TRANSDUCER W/STOPCOCK (MISCELLANEOUS) ×2 IMPLANT
TUBING CIL FLEX 10 FLL-RA (TUBING) ×2 IMPLANT
WIRE HI TORQ VERSACORE-J 145CM (WIRE) ×2 IMPLANT

## 2020-09-19 NOTE — Progress Notes (Signed)
ANTICOAGULATION CONSULT NOTE - Initial Consult  Pharmacy Consult for heparin Indication: chest pain/ACS  Allergies  Allergen Reactions  . Lisinopril Swelling  . Losartan     Nausea, Headache  . Other     States allergies to multiple antibiotics but NAMES UNKNOWN.  Marland Kitchen Polytrim [Polymyxin B-Trimethoprim]     Burning eyes, shortness of breath, chest pain  . Spironolactone     Dizziness, Nausea  . Codeine Palpitations    Patient Measurements: Height: 5\' 4"  (162.6 cm) Weight: 83.9 kg (185 lb) IBW/kg (Calculated) : 54.7 Heparin Dosing Weight: 73 kg  Vital Signs: Temp: 97.3 F (36.3 C) (06/02 0610) Temp Source: Oral (06/02 0610) BP: 164/89 (06/02 0952) Pulse Rate: 81 (06/02 0952)  Labs: Recent Labs    09/19/20 0612 09/19/20 0829  HGB 14.3  --   HCT 42.2  --   PLT 266  --   CREATININE 0.97  --   TROPONINIHS 6 5    Estimated Creatinine Clearance: 60.6 mL/min (by C-G formula based on SCr of 0.97 mg/dL).   Medical History: Past Medical History:  Diagnosis Date  . Acid reflux   . CAD (coronary artery disease)    a. LHC 08/10/19: 100% of prox to mid LAD s/p DES, 60% of 1st Mrg  . Essential hypertension   . History of cardiomyopathy    Normalization of LVEF 02/2020  . Hyperlipidemia     Medications:  (Not in a hospital admission)   Assessment: Pharmacy consulted to dose heparin in patient with chest pain/ACS. History of CAD s/p STEMI 07/2019.  Patient is not on anticoagulation prior to admission.   Goal of Therapy:  Heparin level 0.3-0.7 units/ml Monitor platelets by anticoagulation protocol: Yes   Plan:  Give 4000 units bolus x 1 Start heparin infusion at 900 units/hr Check anti-Xa level in 6 hours and daily while on heparin Continue to monitor H&H and platelets  08/2019, PharmD Clinical Pharmacist 09/19/2020 10:38 AM

## 2020-09-19 NOTE — ED Provider Notes (Signed)
Blood pressure (!) 160/75, pulse 80, temperature (!) 97.3 F (36.3 C), temperature source Oral, resp. rate 17, height 5\' 4"  (1.626 m), weight 83.9 kg, SpO2 97 %.  Assuming care from Dr. .  In short, Gloria Thompson is a 65 y.o. female with a chief complaint of Chest Pain .  Refer to the original H&P for additional details.  The current plan of care is to follow up on repeat troponin and discuss with Cardiology.   EKG Interpretation  Date/Time:  Thursday September 19 2020 06:13:18 EDT Ventricular Rate:  79 PR Interval:  149 QRS Duration: 89 QT Interval:  392 QTC Calculation: 450 R Axis:   23 Text Interpretation: Sinus rhythm Low voltage, precordial leads Anteroseptal infarct, old Minimal ST depression, inferior leads No significant change was found Confirmed by [email or web address] [phone]) on 09/19/2020 6:16:17 AM      09:15 AM  Second troponin has resulted at 5.  Patient describes to me intermittent pain in her chest going to the left arm and left jaw.  States that she has had intermittent pain since being here in the emergency department but then resolves.  EKG from arrival seems similar to prior values.  I reviewed the patient's cath from last year after her LAD was fully occluded but ultimately stented.  Will discuss with cardiology regarding patient's ultimate disposition and if symptoms raise suspicion for unstable angina.   Spoke with Dr. 11/19/2020 with Cardiology who will come by to consult. Appreciate assistance with case.   Discussed patient's case with Cardiology to request admission. Patient and family (if present) updated with plan. Care transferred to Cardiology service.  I reviewed all nursing notes, vitals, pertinent old records, EKGs, labs, imaging (as available).    Wyline Mood, MD 09/19/20 1115

## 2020-09-19 NOTE — H&P (Addendum)
Cardiology Admission History and Physical:   Patient ID: HODAN WURTZ MRN: [id number]; DOB: Sep 02, 1955   Admission date: 09/19/2020  PCP:  Patient, No Pcp Per (Inactive)   [facility] Providers Cardiologist:  Nona Dell, MD       Chief Complaint:  Chest Pain  Patient Profile:   Gloria Thompson is a 65 y.o. female with past medical history of CAD (s/p STEMI in 07/2019 with DES to LAD), HFimpEF (EF 25% by echo in 07/2019, normalized to 55-60% by repeat echo in 02/2020), palpitations (monitor in 03/2020 showing PAC's PVC's and brief SVT), HTN and HLD who is being seen 09/19/2020 for the evaluation of chest pain at the request of Dr. Jacqulyn Bath.  History of Present Illness:   Gloria Thompson was recently examined by Dr. Diona Browner on 09/13/2020 and had recently been diagnosed with angioedema felt to be secondary to Lisinopril but this had not been replaced with anything and BP was elevated to 184/104 during her visit. Therefore, she was continued on Toprol-XL 100mg  daily and was started on Amlodipine 2.5mg  daily.   She presented to [facility] [place] ED this morning for evaluation of chest pain which awoke her from sleep. In talking with the patient today, she reports feeling slightly dizzy since being started Amlodipine at her prior office visit but had not really experienced any other symptoms until this morning. At around 0230, she was awoken from sleep with chest pressure along her left pectoral region and this radiated into her left jaw and left arm.  She reports associated dyspnea and diaphoresis at that time. She tried walking to the restroom and symptoms persisted. She did take nitroglycerin with improvement in symptoms but they represented which prompted her to come to the ED for evaluation.  She reports intermittent symptoms since being in the ED and says her chest pain is currently an 8 out of 10 at this time. Denies any recent orthopnea, PND or pitting edema.   Initial labs show WBC 7.1, Hgb  14.3, platelets 266, Na+ 137, K+ 3.8 and creatinine 0.97. Initial and repeat Hs Troponin values negative at 6 and 5. CXR shows no acute cardiopulmonary abnormalities. EKG shows NSR, HR 79 with slight ST depression along the inferior leads which is similar to prior tracings.     Past Medical History:  Diagnosis Date  . Acid reflux   . CAD (coronary artery disease)    a. LHC 08/10/19: 100% of prox to mid LAD s/p DES, 60% of 1st Mrg  . Essential hypertension   . History of cardiomyopathy    Normalization of LVEF 02/2020  . Hyperlipidemia     Past Surgical History:  Procedure Laterality Date  . APPENDECTOMY    . CESAREAN SECTION  03/2020  . CORONARY/GRAFT ACUTE MI REVASCULARIZATION N/A 08/10/2019   Procedure: Coronary/Graft Acute MI Revascularization;  Surgeon: 08/12/2019, MD;  Location: [facility] INVASIVE CV LAB;  Service: Cardiovascular;  Laterality: N/A;  . kidney stones    . LEFT HEART CATH AND CORONARY ANGIOGRAPHY N/A 08/10/2019   Procedure: LEFT HEART CATH AND CORONARY ANGIOGRAPHY;  Surgeon: 08/12/2019, MD;  Location: [facility] INVASIVE CV LAB;  Service: Cardiovascular;  Laterality: N/A;     Medications Prior to Admission: Prior to Admission medications   Medication Sig Start Date End Date Taking? Authorizing Provider  amLODipine (NORVASC) 2.5 MG tablet Take 1 tablet (2.5 mg total) by mouth daily. 09/13/20 12/12/20 Yes 12/14/20, MD  aspirin EC 81 MG tablet Take 1 tablet (  81 mg total) by mouth daily. 08/11/19  Yes Bhagat, Bhavinkumar, PA  atorvastatin (LIPITOR) 80 MG tablet Take 0.5 tablets (40 mg total) by mouth daily. 08/20/19  Yes Strader, Lennart Pall, PA-C  clopidogrel (PLAVIX) 75 MG tablet Take 1 tablet (75 mg total) by mouth daily. 02/07/20  Yes Kroeger, Dot Lanes M., PA-C  diphenhydrAMINE (BENADRYL) 25 MG tablet Take 1 tablet (25 mg total) by mouth every 6 (six) hours as needed for allergies. 05/31/20  Yes Vanetta Mulders, MD  esomeprazole (NEXIUM) 20 MG capsule Take 20 mg by  mouth daily before breakfast.   Yes [provider]  famotidine (PEPCID) 20 MG tablet Take 1 tablet (20 mg total) by mouth 2 (two) times daily. 05/31/20  Yes Vanetta Mulders, MD  metoprolol succinate (TOPROL-XL) 100 MG 24 hr tablet Take 1 tablet (100 mg total) by mouth daily. Take with or immediately following a meal. 04/16/20  Yes Jonelle Sidle, MD  nitroGLYCERIN (NITROSTAT) 0.4 MG SL tablet Place 1 tablet (0.4 mg total) under the tongue every 5 (five) minutes as needed. Patient taking differently: Place 0.4 mg under the tongue every 5 (five) minutes x 3 doses as needed for chest pain (if no relief after 2nd dose, proceed to the ED for an evaluation or call [phone]). 08/11/19  Yes Bhagat, [place], PA     Allergies:    Allergies  Allergen Reactions  . Lisinopril Swelling  . Losartan     Nausea, Headache  . Other     States allergies to multiple antibiotics but NAMES UNKNOWN.  Marland Kitchen Polytrim [Polymyxin B-Trimethoprim]     Burning eyes, shortness of breath, chest pain  . Spironolactone     Dizziness, Nausea  . Codeine Palpitations    Social History:   Social History   Socioeconomic History  . Marital status: Widowed    Spouse name: Not on file  . Number of children: Not on file  . Years of education: Not on file  . Highest education level: Not on file  Occupational History  . Not on file  Tobacco Use  . Smoking status: Never Smoker  . Smokeless tobacco: Never Used  Vaping Use  . Vaping Use: Never used  Substance and Sexual Activity  . Alcohol use: No  . Drug use: No  . Sexual activity: Not on file  Other Topics Concern  . Not on file  Social History Narrative  . Not on file   Social Determinants of Health   Financial Resource Strain: Not on file  Food Insecurity: Not on file  Transportation Needs: Not on file  Physical Activity: Not on file  Stress: Not on file  Social Connections: Not on file  Intimate Partner Violence: Not on file   Family History:    The patient's family history includes COPD in her sister; Diabetes in her sister; Heart attack in her father; Heart disease in her brother, brother, brother, brother, and mother; Hyperlipidemia in her sister and sister; Hypertension in her brother, brother, brother, brother, sister, and sister.    ROS:  Please see the history of present illness.   All other ROS reviewed and negative.     Physical Exam/Data:   Vitals:   09/19/20 0830 09/19/20 0900 09/19/20 0930 09/19/20 0952  BP: (!) 155/76 139/74 (!) 155/81 (!) 164/89  Pulse: 66 72 73 81  Resp: 18 18  16   Temp:      TempSrc:      SpO2: 98% 96% 98% 97%  Weight:  Height:       No intake or output data in the 24 hours ending 09/19/20 1102 Last 3 Weights 09/19/2020 09/13/2020 06/27/2020  Weight (lbs) 185 lb 187 lb 180 lb  Weight (kg) 83.915 kg 84.823 kg 81.647 kg     Body mass index is 31.76 kg/m.  General:  Well nourished, well developed female appearing in no acute distress. HEENT: normal Lymph: no adenopathy Neck: no JVD Endocrine:  No thryomegaly Vascular: No carotid bruits; FA pulses 2+ bilaterally without bruits  Cardiac:  normal S1, S2; RRR; no murmur  Lungs:  clear to auscultation bilaterally, no wheezing, rhonchi or rales  Abd: soft, nontender, no hepatomegaly  Ext: no pitting edema Musculoskeletal:  No deformities, BUE and BLE strength normal and equal Skin: warm and dry  Neuro:  CNs 2-12 intact, no focal abnormalities noted Psych:  Normal affect    EKG:  The ECG that was done was personally reviewed and demonstrates NSR, HR 79 with slight ST depression along the inferior leads which is similar to prior tracings.     Relevant CV Studies:  Cardiac Catheterization: 07/2019  1st Mrg lesion is 60% stenosed.  Prox LAD to Mid LAD lesion is 100% stenosed.  Post intervention, there is a 0% residual stenosis.  A stent was successfully placed.   Acute ST segment elevation myocardial infarction secondary to  total proximal occlusion of the LAD which arises from a common ostium or separate ostium from the circumflex vessel.  Dominant left circumflex vessel with 60% ostial narrowing of a very proximal marginal vessel.  There is a high diagonal/ramus immediate-like vessel which arises more proximally.  Normal RCA with very small PDA.  LVEDP 25 mmHg  Very difficult but ultimately successful PCI to a totally occluded LAD with ultimate insertion of a 3.0 x 30 mm Resolute Onyx stent postdilated to 3.34 mm with the long occluded segment being reduced to 0% and resumption of brisk TIMI-3 flow in a large LAD system which wraps around the LV apex to supply the distal third of the inferior wall.  RECOMMENDATION: DAPT for minimum of 1 year.  Aggressive lipid-lowering therapy with target LDL less than 70.  Optimal blood pressure control.  A 2D echo Doppler study will be done to assess LV function.   Echocardiogram: 02/2020 IMPRESSIONS    1. Left ventricular ejection fraction, by estimation, is 55 to 60%. The  left ventricle has normal function. The left ventricle has no regional  wall motion abnormalities. There is mild left ventricular hypertrophy.  Left ventricular diastolic parameters  are indeterminate.  2. Right ventricular systolic function is normal. The right ventricular  size is normal. Tricuspid regurgitation signal is inadequate for assessing  PA pressure.  3. Left atrial size was upper normal.  4. The mitral valve is grossly normal. Trivial mitral valve  regurgitation.  5. The aortic valve is tricuspid. Aortic valve regurgitation is not  visualized.  6. The inferior vena cava is normal in size with greater than 50%  respiratory variability, suggesting right atrial pressure of 3 mmHg.   Event Monitor: 03/2020 ZIO XT reviewed.  6 days 18 hours analyzed.  Predominant rhythm is sinus with heart rate ranging from 54 bpm up to 113 bpm and average heart rate 73 bpm.  Rare PACs  including couplets and triplets were noted, less than 1% total beats.  Rare PVCs including couplets were noted, less than 1% total beats.  There were a few brief episodes of SVT noted, the longest  of which lasted for 10 beats.  No sustained arrhythmias or pauses.   Laboratory Data:  High Sensitivity Troponin:   Recent Labs  Lab 09/19/20 0612 09/19/20 0829  TROPONINIHS 6 5      Chemistry Recent Labs  Lab 09/19/20 0612  NA 137  K 3.8  CL 104  CO2 28  GLUCOSE 122*  BUN 17  CREATININE 0.97  CALCIUM 9.0  GFRNONAA >60  ANIONGAP 5    No results for input(s): PROT, ALBUMIN, AST, ALT, ALKPHOS, BILITOT in the last 168 hours. Hematology Recent Labs  Lab 09/19/20 0612  WBC 7.1  RBC 4.47  HGB 14.3  HCT 42.2  MCV 94.4  MCH 32.0  MCHC 33.9  RDW 11.4*  PLT 266   BNPNo results for input(s): BNP, PROBNP in the last 168 hours.  DDimer No results for input(s): DDIMER in the last 168 hours.   Radiology/Studies:  DG Chest 2 View  Result Date: 09/19/2020 CLINICAL DATA:  65 year old female with sudden onset of chest pain this morning. EXAM: CHEST - 2 VIEW COMPARISON:  Chest x-ray 06/27/2020. FINDINGS: Lung volumes are normal. No consolidative airspace disease. No pleural effusions. No pneumothorax. No pulmonary nodule or mass noted. Pulmonary vasculature and the cardiomediastinal silhouette are within normal limits. Atherosclerosis in the thoracic aorta. Surgical clips project over the right upper quadrant of the abdomen, likely from prior cholecystectomy. IMPRESSION: 1.  No radiographic evidence of acute cardiopulmonary disease. 2. Aortic atherosclerosis. Electronically Signed   By: Trudie Reed M.D.   On: 09/19/2020 06:55     Assessment and Plan:   1. Chest Pain concerning for Unstable Angina - She presents with intermittent episodes of chest discomfort which awoke her from sleep this morning with radiating pain into her jaw and left arm. Reports associated dyspnea and  diaphoresis and symptoms resemble her prior MI.  - Her troponin values have been negative thus far and EKG is without acute ST changes.  Given her continued episodes of chest pain, reviewed with Dr. Wyline Mood and will plan for a cardiac catheterization for definitive evaluation. This was reviewed with the patient and she is in agreement as well. The patient understands that risks include but are not limited to stroke (1 in 1000), death (1 in 1000), kidney failure [usually temporary] (1 in 500), bleeding (1 in 200), allergic reaction [possibly serious] (1 in 200). Will start IV Heparin and IV NTG given her continued pain. Continue PTA ASA, Plavix, Atorvastatin and Toprol-XL.   2. CAD - She is s/p STEMI in 07/2019 with DES to LAD and had residual 60% first marginal stenosis at that time. Will plan for a repeat cardiac catheterization as outlined above. - Continue ASA, Plavix, Atorvastatin and Toprol-XL.  3. HFimpEF - Her EF was previously at 25% by echo in 07/2019, normalized to 55-60% by repeat echo in 02/2020. No reports of orthopnea, PND or lower extremity edema and she appears euvolemic by examination today. Remains on beta-blocker therapy  4. Palpitations - Prior monitor in 03/2020 showed  PAC's PVC's and brief SVT.  She did have a 10 beat run of what appears most consistent with SVT while in the ED. Continue Toprol-XL.   5. HTN - Her BP has been elevated while in the ED, at 164/89 on most recent check. Will start IV NTG as outlined above and restart her PTA Toprol-XL 100 mg daily.  Will try to restart Amlodipine 2.5 mg daily but she did report some dizziness with this. She has previously  been intolerant to Lisinopril (angioedema), Losartan (nausea and headache) and Spironolactone (nausea and dizziness). Could try Chlorthalidone if intolerant to Amlodipine.   6. HLD - LDL was at 83 in 02/2020. Will order a repeat FLP.  If LDL remains above goal, would recommend further titration of Atorvastatin from  40 mg daily to 80 mg daily or switching to Crestor.    Risk Assessment/Risk Scores:    TIMI Risk Score for Unstable Angina or Non-ST Elevation MI:   The patient's TIMI risk score is 5, which indicates a 26% risk of all cause mortality, new or recurrent myocardial infarction or need for urgent revascularization in the next 14 days.{   For questions or updates, please contact [facility] Please consult [email or web address] for contact info under     Signed, Ellsworth LennoxBrittany M Strader, PA-C  09/19/2020 11:02 AM   Attending note Patient seen and discussed with PA Iran OuchStrader, I agree with her documentation. 65 yo female history of CAD with STEMI 07/2019 DES to LAD, prior LV dysfunction that normalized, HTN, HL presents with chest pain.   K 3.8 Cr 0.97 BUN 17 WBC 7.1 Hgb 14.3 Plt 266 Trop 6-->5 COVID pending EKG SR, mild inferiro ST depressions taht appear chronic, ansteroseptal Qwaves CXR no acute process  07/2019 cath: prox to mid LAD occluded, OM1 60%. Received DES to LAD  Patient with CAD prior MI last year presents with chest pain symptoms concerning for unstable angina. EKG and enzymes benign so far, however describes very typical angina similar to her prior angina symptoms 1 year ago. [facility]. Medical therapy with ASA, plavix 75, hep gtt, toprol 100, atorva 80, nitro gtt. She has ACE/ARB allergy   Dina RichJonathan Eriyonna Matsushita MD

## 2020-09-19 NOTE — Interval H&P Note (Signed)
Cath Lab Visit (complete for each Cath Lab visit)  Clinical Evaluation Leading to the Procedure:   ACS: Yes.    Non-ACS:    Anginal Classification: CCS II  Anti-ischemic medical therapy: Minimal Therapy (1 class of medications)  Non-Invasive Test Results: No non-invasive testing performed  Prior CABG: No previous CABG      History and Physical Interval Note:  09/19/2020 4:03 PM  Gloria Thompson  has presented today for surgery, with the diagnosis of unstable angina.  The various methods of treatment have been discussed with the patient and family. After consideration of risks, benefits and other options for treatment, the patient has consented to  Procedure(s): LEFT HEART CATH AND CORONARY ANGIOGRAPHY (N/A) as a surgical intervention.  The patient's history has been reviewed, patient examined, no change in status, stable for surgery.  I have reviewed the patient's chart and labs.  Questions were answered to the patient's satisfaction.     Nanetta Batty

## 2020-09-19 NOTE — ED Triage Notes (Addendum)
Pt BIB [facility] from home c/o sudden onset of CP this morning while sleeping. NTG x3 and 324 ASA prior to arrival. EMS reports BP 195/99 HX MI in March

## 2020-09-19 NOTE — ED Notes (Signed)
Pt transporting to [facility] via [facility]

## 2020-09-19 NOTE — ED Provider Notes (Signed)
[facility]-EMERGENCY DEPT [facility] Emergency Department Provider Note MRN:  [id number]  Arrival date & time: 09/19/20     Chief Complaint   Chest Pain   History of Present Illness   Gloria Thompson is a 65 y.o. year-old female with a history of CAD presenting to the ED with chief complaint of chest pain.  Location: Left chest Duration: 2 or 3 hours Onset: Sudden onset that woke her from Timing: Intermittent Description: Dull pressure ache Severity: 7 out of 10 Exacerbating/Alleviating Factors: None Associated Symptoms: Paresthesia to left jaw, left arm, dizziness, diaphoresis, nausea, shortness of breath Pertinent Negatives: Denies fever or cough, no abdominal pain, no numbness or weakness to the arms or legs   Review of Systems  A complete 10 system review of systems was obtained and all systems are negative except as noted in the HPI and PMH.   Patient's Health History    Past Medical History:  Diagnosis Date  . Acid reflux   . CAD (coronary artery disease)    a. LHC 08/10/19: 100% of prox to mid LAD s/p DES, 60% of 1st Mrg  . Essential hypertension   . History of cardiomyopathy    Normalization of LVEF 02/2020  . Hyperlipidemia     Past Surgical History:  Procedure Laterality Date  . APPENDECTOMY    . CESAREAN SECTION  [id number]  . CORONARY/GRAFT ACUTE MI REVASCULARIZATION N/A 08/10/2019   Procedure: Coronary/Graft Acute MI Revascularization;  Surgeon: Lennette Bihari, MD;  Location: [facility] INVASIVE CV LAB;  Service: Cardiovascular;  Laterality: N/A;  . kidney stones    . LEFT HEART CATH AND CORONARY ANGIOGRAPHY N/A 08/10/2019   Procedure: LEFT HEART CATH AND CORONARY ANGIOGRAPHY;  Surgeon: Lennette Bihari, MD;  Location: [facility] INVASIVE CV LAB;  Service: Cardiovascular;  Laterality: N/A;    Family History  Problem Relation Age of Onset  . Heart disease Mother   . Heart attack Father   . Diabetes Sister   . Heart disease Brother   . Hypertension Brother   .  Hypertension Sister   . Hyperlipidemia Sister   . COPD Sister   . Hypertension Sister   . Hyperlipidemia Sister   . Heart disease Brother   . Hypertension Brother   . Hypertension Brother   . Heart disease Brother   . Heart disease Brother   . Hypertension Brother     Social History   Socioeconomic History  . Marital status: Widowed    Spouse name: Not on file  . Number of children: Not on file  . Years of education: Not on file  . Highest education level: Not on file  Occupational History  . Not on file  Tobacco Use  . Smoking status: Never Smoker  . Smokeless tobacco: Never Used  Vaping Use  . Vaping Use: Never used  Substance and Sexual Activity  . Alcohol use: No  . Drug use: No  . Sexual activity: Not on file  Other Topics Concern  . Not on file  Social History Narrative  . Not on file   Social Determinants of Health   Financial Resource Strain: Not on file  Food Insecurity: Not on file  Transportation Needs: Not on file  Physical Activity: Not on file  Stress: Not on file  Social Connections: Not on file  Intimate Partner Violence: Not on file     Physical Exam   Vitals:   09/19/20 0615 09/19/20 0617  BP:  (!) 173/103  Pulse: 84  Resp: 19   Temp:    SpO2: 98%     CONSTITUTIONAL: Well-appearing, NAD NEURO:  Alert and oriented x 3, no focal deficits EYES:  eyes equal and reactive ENT/NECK:  no LAD, no JVD CARDIO: Regular rate, well-perfused, normal S1 and S2 PULM:  CTAB no wheezing or rhonchi GI/GU:  normal bowel sounds, non-distended, non-tender MSK/SPINE:  No gross deformities, no edema SKIN:  no rash, atraumatic PSYCH:  Appropriate speech and behavior  *Additional and/or pertinent findings included in MDM below  Diagnostic and Interventional Summary    EKG Interpretation  Date/Time:  Thursday September 19 2020 06:13:18 EDT Ventricular Rate:  79 PR Interval:  149 QRS Duration: 89 QT Interval:  392 QTC Calculation: 450 R  Axis:   23 Text Interpretation: Sinus rhythm Low voltage, precordial leads Anteroseptal infarct, old Minimal ST depression, inferior leads No significant change was found Confirmed by Kennis Carina [phone]) on 09/19/2020 6:16:17 AM      Labs Reviewed  BASIC METABOLIC PANEL  CBC  TROPONIN I (HIGH SENSITIVITY)    DG Chest 2 View    (Results Pending)    Medications - No data to display   Procedures  /  Critical Care Procedures  ED Course and Medical Decision Making  I have reviewed the triage vital signs, the nursing notes, and pertinent available records from the EMR.  Listed above are laboratory and imaging tests that I personally ordered, reviewed, and interpreted and then considered in my medical decision making (see below for details).  Concern for cardiac chest pain in this 65 year old female with known CAD, stent placement 1 year ago in the LAD.  EKG is without significant changes, there are ST depressions that are unchanged.  Awaiting troponin.  Suspect will need admission or at the very least cardiology consultation.  Signed out to oncoming provider at shift change.  Awaiting second troponin.       Elmer Sow. Pilar Plate, MD [facility] Emergency Medicine [facility] [email or web address]  Final Clinical Impressions(s) / ED Diagnoses     ICD-10-CM   1. Chest pain, unspecified type  R07.9     ED Discharge Orders    None       Discharge Instructions Discussed with and Provided to Patient:   Discharge Instructions   None       Sabas Sous, MD 09/19/20 [phone]

## 2020-09-20 ENCOUNTER — Other Ambulatory Visit (HOSPITAL_COMMUNITY): Payer: Self-pay

## 2020-09-20 ENCOUNTER — Observation Stay (HOSPITAL_BASED_OUTPATIENT_CLINIC_OR_DEPARTMENT_OTHER): Payer: PPO

## 2020-09-20 ENCOUNTER — Encounter (HOSPITAL_COMMUNITY): Payer: Self-pay | Admitting: Cardiovascular Disease

## 2020-09-20 DIAGNOSIS — K219 Gastro-esophageal reflux disease without esophagitis: Secondary | ICD-10-CM

## 2020-09-20 DIAGNOSIS — Z7982 Long term (current) use of aspirin: Secondary | ICD-10-CM | POA: Diagnosis not present

## 2020-09-20 DIAGNOSIS — R079 Chest pain, unspecified: Secondary | ICD-10-CM

## 2020-09-20 DIAGNOSIS — Z79899 Other long term (current) drug therapy: Secondary | ICD-10-CM | POA: Diagnosis not present

## 2020-09-20 DIAGNOSIS — Z20822 Contact with and (suspected) exposure to covid-19: Secondary | ICD-10-CM | POA: Diagnosis not present

## 2020-09-20 DIAGNOSIS — I2510 Atherosclerotic heart disease of native coronary artery without angina pectoris: Secondary | ICD-10-CM | POA: Diagnosis not present

## 2020-09-20 DIAGNOSIS — I25110 Atherosclerotic heart disease of native coronary artery with unstable angina pectoris: Secondary | ICD-10-CM | POA: Diagnosis not present

## 2020-09-20 DIAGNOSIS — I10 Essential (primary) hypertension: Secondary | ICD-10-CM | POA: Diagnosis not present

## 2020-09-20 LAB — LIPID PANEL
Cholesterol: 153 mg/dL (ref 0–200)
HDL: 48 mg/dL (ref >40)
LDL Cholesterol: 88 mg/dL (ref 0–99)
Total CHOL/HDL Ratio: 3.2 RATIO
Triglycerides: 83 mg/dL (ref <150)
VLDL: 17 mg/dL (ref 0–40)

## 2020-09-20 LAB — CBC
HCT: 41 % (ref 36.0–46.0)
Hemoglobin: 14.1 g/dL (ref 12.0–15.0)
MCH: 31.8 pg (ref 26.0–34.0)
MCHC: 34.4 g/dL (ref 30.0–36.0)
MCV: 92.3 fL (ref 80.0–100.0)
Platelets: 254 10*3/uL (ref 150–400)
RBC: 4.44 MIL/uL (ref 3.87–5.11)
RDW: 11.4 % — ABNORMAL LOW (ref 11.5–15.5)
WBC: 5.7 10*3/uL (ref 4.0–10.5)
nRBC: 0 % (ref 0.0–0.2)

## 2020-09-20 LAB — BASIC METABOLIC PANEL
Anion gap: 7 (ref 5–15)
BUN: 10 mg/dL (ref 8–23)
CO2: 27 mmol/L (ref 22–32)
Calcium: 9.2 mg/dL (ref 8.9–10.3)
Chloride: 102 mmol/L (ref 98–111)
Creatinine, Ser: 0.93 mg/dL (ref 0.44–1.00)
GFR, Estimated: >60 mL/min (ref >60)
Glucose, Bld: 110 mg/dL — ABNORMAL HIGH (ref 70–99)
Potassium: 4.3 mmol/L (ref 3.5–5.1)
Sodium: 136 mmol/L (ref 135–145)

## 2020-09-20 LAB — ECHOCARDIOGRAM COMPLETE
AR max vel: 2.74 cm2
AV Area VTI: 2.72 cm2
AV Area mean vel: 2.68 cm2
AV Mean grad: 3 mmHg
AV Peak grad: 5.9 mmHg
Ao pk vel: 1.21 m/s
Area-P 1/2: 4.49 cm2
Height: 64 in
S' Lateral: 2.4 cm
Weight: 2982.4 oz

## 2020-09-20 MED ORDER — HYDRALAZINE HCL 25 MG PO TABS
25.0000 mg | ORAL_TABLET | Freq: Three times a day (TID) | ORAL | Status: DC
  Administered 2020-09-20: 25 mg via ORAL
  Filled 2020-09-20: qty 1

## 2020-09-20 MED ORDER — ATORVASTATIN CALCIUM 80 MG PO TABS: 80.0000 mg | ORAL_TABLET | Freq: Every day | ORAL | 6 refills | Status: DC

## 2020-09-20 MED ORDER — PANTOPRAZOLE SODIUM 40 MG PO TBEC
40.0000 mg | DELAYED_RELEASE_TABLET | Freq: Every day | ORAL | 1 refills | Status: DC
  Filled 2020-09-20: qty 30, 30d supply, fill #0

## 2020-09-20 MED ORDER — HYDRALAZINE HCL 25 MG PO TABS
25.0000 mg | ORAL_TABLET | Freq: Three times a day (TID) | ORAL | 0 refills | Status: DC
  Filled 2020-09-20: qty 90, 30d supply, fill #0

## 2020-09-20 NOTE — Care Management (Signed)
1027 09-20-20 Case Manager provided patient with the [facility] Number to call for a PCP in the area. Patient is aware to call the number on the back of her insurance card for PCP assistance as well.

## 2020-09-20 NOTE — Care Management Obs Status (Signed)
MEDICARE OBSERVATION STATUS NOTIFICATION   Patient Details  Name: Gloria Thompson MRN: [id number] Date of Birth: Dec 13, 1955   Medicare Observation Status Notification Given:  Yes    Gala Lewandowsky, RN 09/20/2020, 10:26 AM

## 2020-09-20 NOTE — Discharge Summary (Signed)
Discharge Summary    Patient ID: Gloria Thompson MRN: [id number]; DOB: 06-19-1955  Admit date: 09/19/2020 Discharge date: 09/20/2020  PCP:  Patient, No Pcp Per (Inactive)   [facility] Providers Cardiologist:  Nona Dell, MD   {  Discharge Diagnoses    Active Problems:   Hypertension   Hyperlipidemia   Unstable angina [facility])   Chest pain of uncertain etiology   GERD (gastroesophageal reflux disease)    Diagnostic Studies/Procedures    Cath: 09/19/20   1st Mrg lesion is 50% stenosed.  Previously placed Prox LAD to Mid LAD stent (unknown type) is widely patent.   IMPRESSION:Gloria Thompson was transferred from [facility] for chest pain that awakened her from sleep.  She is status post LAD stenting a little over a year ago with an excellent result by Dr. Tresa Endo.  Her stent is widely patent.  She has no other disease.  There is no "culprit lesion".  She will be treated for noncardiac chest pain.  The sheath was removed and a TR band was placed on the right wrist to achieve patent hemostasis.  The patient left the lab in stable condition.  Nanetta Batty. MD, [facility] 09/19/2020  Echo: 09/20/20  IMPRESSIONS    1. Left ventricular ejection fraction, by estimation, is 60 to 65%. The  left ventricle has normal function. The left ventricle has no regional  wall motion abnormalities. There is mild concentric left ventricular  hypertrophy. Left ventricular diastolic  parameters are consistent with Grade I diastolic dysfunction (impaired  relaxation).  2. Right ventricular systolic function is normal. The right ventricular  size is normal. Tricuspid regurgitation signal is inadequate for assessing  PA pressure.  3. The mitral valve is normal in structure. Trivial mitral valve  regurgitation. No evidence of mitral stenosis.  4. The aortic valve is tricuspid. There is mild calcification of the  aortic valve. There is mild thickening of the aortic valve. Aortic valve   regurgitation is not visualized. Mild aortic valve sclerosis is present,  with no evidence of aortic valve  stenosis.  5. The inferior vena cava is normal in size with greater than 50%  respiratory variability, suggesting right atrial pressure of 3 mmHg.   Comparison(s): No significant change from prior study.  _____________   History of Present Illness     Gloria Thompson is a 65 y.o. female with past medical history of CAD (s/p STEMI in 07/2019 with DES to LAD), HFimpEF (EF 25% by echo in 07/2019, normalized to 55-60% by repeat echo in 02/2020), palpitations (monitor in 03/2020 showing PAC's PVC's and brief SVT), HTN and HLD who presented with chest pain.   Gloria Thompson was recently examined by Dr. Diona Browner on 09/13/2020 and had recently been diagnosed with angioedema felt to be secondary to Lisinopril but this had not been replaced with anything and BP was elevated to 184/104 during her visit. Therefore, she was continued on Toprol-XL 100mg  daily and was started on Amlodipine 2.5mg  daily.   She presented to [facility] ED this morning for evaluation of chest pain which awoke her from sleep. In talking with the patient today, she reports feeling slightly dizzy since being started Amlodipine at her prior office visit but had not really experienced any other symptoms until this morning. At around 0230, she was awoken from sleep with chest pressure along her left pectoral region and this radiated into her left jaw and left arm.  She reports associated dyspnea and diaphoresis at that time. She  tried walking to the restroom and symptoms persisted. She did take nitroglycerin with improvement in symptoms but they represented which prompted her to come to the ED for evaluation.  She reports intermittent symptoms since being in the ED and says her chest pain is currently an 8 out of 10 at this time. Denies any recent orthopnea, PND or pitting edema.   Initial labs show WBC 7.1, Hgb 14.3, platelets 266,  Na+ 137, K+ 3.8 and creatinine 0.97. Initial and repeat Hs Troponin values negative at 6 and 5. CXR shows no acute cardiopulmonary abnormalities. EKG shows NSR, HR 79 with slight ST depression along the inferior leads which is similar to prior tracings.    Hospital Course     1. Chest Pain: She presents with intermittent episodes of chest discomfort which awoke her from sleep the morning of admission. hsTn was negative.  Given her hx and concern for unstable angina she was [facility] and underwent cardiac cath with patent pLAD stent and 1st OM of 50%. No culprit lesion. Suspect symptoms have have been secondary to uncontrolled. BPs. Follow up echo showed normal EF with no rWMA. -- Continue  ASA, Plavix, Atorvastatin and Toprol-XL.   2. HFimpEF: Her EF was previously at 25% by echo in 07/2019, normalized to 55-60% by repeat echo in 02/2020. No reports of orthopnea, PND or lower extremity edema and she appears euvolemic during admission. -- Remains on beta-blocker therapy  4. Palpitations: Prior monitor in 03/2020 showed PAC's PVC's and brief SVT.  She did have a 10 beat run of what appears most consistent with SVT while in the ED at [facility] but no further episodes during admission. -- Continue Toprol-XL.   5. HTN:  Her BP has been elevated as an outpatient and during admission.  -- she has several issues with BP meds, allergy to ACEi/ARB. Her norvasc has been making her nauseated. Tried on spiro with nausea/dizziness. -- will continue with Toprol-XL 100 mg daily, though she does report headaches which she thinks may be associated with this but agreeable to continue at current dose.   -- add hydralazine 25mg  TID and have her follow her Bps/bring to follow up appt.   6. HLD: LDL 88 -- further increase atorvastatin to 80mg  daily -- FLP/LFTs in 8 weeks  7. GERD: on Nexium PTA, will transition to protonix given she is on plavix.   General: Well developed, well nourished, female appearing in no  acute distress. Head: Normocephalic, atraumatic.  Neck: Supple without bruits, JVD Lungs:  Resp regular and unlabored, CTA. Heart: RRR, S1, S2, no S3, S4, or murmur; no rub. Abdomen: Soft, non-tender, non-distended with normoactive bowel sounds.  Extremities: No clubbing, cyanosis, edema. Distal pedal pulses are 2+ bilaterally. Right cath site stable without bruising or hematoma Neuro: Alert and oriented X 3. Moves all extremities spontaneously. Psych: Normal affect.   Patient was seen by Dr. Clifton JamesMcAlhany and deemed stable for discharge home. Follow up in the office has been arranged. [facility]. Educated by pharmD.   Did the patient have an acute coronary syndrome (MI, NSTEMI, STEMI, etc) this admission?:  No                               Did the patient have a percutaneous coronary intervention (stent / angioplasty)?:  No.       _____________  Discharge Vitals Blood pressure (!) 147/82, pulse 78, temperature 98.7 F (  37.1 C), temperature source Oral, resp. rate 16, height  (1.626 m), weight 84.6 kg, SpO2 94 %.  Filed Weights   09/19/20 0610 09/20/20 0337  Weight: 83.9 kg 84.6 kg    Labs & Radiologic Studies    CBC Recent Labs    09/19/20 0612 09/20/20 0405  WBC 7.1 5.7  HGB 14.3 14.1  HCT 42.2 41.0  MCV 94.4 92.3  PLT 266 254   Basic Metabolic Panel Recent Labs    63/01/60 0612 09/20/20 0405  NA 137 136  K 3.8 4.3  CL 104 102  CO2 28 27  GLUCOSE 122* 110*  BUN 17 10  CREATININE 0.97 0.93  CALCIUM 9.0 9.2   Liver Function Tests No results for input(s): AST, ALT, ALKPHOS, BILITOT, PROT, ALBUMIN in the last 72 hours. No results for input(s): LIPASE, AMYLASE in the last 72 hours. High Sensitivity Troponin:   Recent Labs  Lab 09/19/20 0612 09/19/20 0829  TROPONINIHS 6 5    BNP Invalid input(s): POCBNP D-Dimer No results for input(s): DDIMER in the last 72 hours. Hemoglobin A1C No results for input(s): HGBA1C in the last 72  hours. Fasting Lipid Panel Recent Labs    09/20/20 0405  CHOL 153  HDL 48  LDLCALC 88  TRIG 83  CHOLHDL 3.2   Thyroid Function Tests No results for input(s): TSH, T4TOTAL, T3FREE, THYROIDAB in the last 72 hours.  Invalid input(s): FREET3 _____________  DG Chest 2 View  Result Date: 09/19/2020 CLINICAL DATA:  65 year old female with sudden onset of chest pain this morning. EXAM: CHEST - 2 VIEW COMPARISON:  Chest x-ray 06/27/2020. FINDINGS: Lung volumes are normal. No consolidative airspace disease. No pleural effusions. No pneumothorax. No pulmonary nodule or mass noted. Pulmonary vasculature and the cardiomediastinal silhouette are within normal limits. Atherosclerosis in the thoracic aorta. Surgical clips project over the right upper quadrant of the abdomen, likely from prior cholecystectomy. IMPRESSION: 1.  No radiographic evidence of acute cardiopulmonary disease. 2. Aortic atherosclerosis. Electronically Signed   By: Trudie Reed M.D.   On: 09/19/2020 06:55   CARDIAC CATHETERIZATION  Result Date: 09/19/2020  1st Mrg lesion is 50% stenosed.  Previously placed Prox LAD to Mid LAD stent (unknown type) is widely patent.  Gloria Thompson is a 65 y.o. female  [id number] LOCATION:  FACILITY: [facility] PHYSICIAN: Nanetta Batty, M.D. 08/26/1955 DATE OF PROCEDURE:  09/19/2020 DATE OF DISCHARGE: CARDIAC CATHETERIZATION History obtained from chart review.Gloria Thompson is a 65 y.o. female with past medical history of CAD (s/p STEMI in 07/2019 with DES to LAD), HFimpEF (EF 25% by echo in 07/2019, normalized to 55-60% by repeat echo in 02/2020), palpitations (monitor in 03/2020 showing PAC's PVC's and brief SVT), HTN and HLD who is being seen 09/19/2020 for the evaluation of chest pain at the request of Dr. Jacqulyn Bath.  Her EKG showed nonspecific changes.  Her enzymes were negative.  She presents now for diagnostic coronary angiography. IMPRESSION:Gloria Hice was transferred from [facility] for chest pain  that awakened her from sleep.  She is status post LAD stenting a little over a year ago with an excellent result by Dr. Tresa Endo.  Her stent is widely patent.  She has no other disease.  There is no "culprit lesion".  She will be treated for noncardiac chest pain.  The sheath was removed and a TR band was placed on the right wrist to achieve patent hemostasis.  The patient left the lab in stable condition. Nanetta Batty. MD,  [facility] 09/19/2020 4:43 PM   ECHOCARDIOGRAM COMPLETE  Result Date: 09/20/2020    ECHOCARDIOGRAM REPORT   Patient Name:   Gloria Thompson Date of Exam: 09/20/2020 Medical Rec #:  [id number]         Height:       64.0 in Accession #:    [id number]        Weight:       186.4 lb Date of Birth:  1955-06-07         BSA:          1.899 m Patient Age:    64 years          BP:           147/82 mmHg Patient Gender: F                 HR:           78 bpm. Exam Location:  Inpatient Procedure: 2D Echo, Cardiac Doppler and Color Doppler Indications:    CAD  History:        Patient has prior history of Echocardiogram examinations, most                 recent 02/29/2020. Previous Myocardial Infarction; Risk                 Factors:Hypertension.  Sonographer:    Shirlean Kelly Referring Phys: [phone] JONATHAN J BERRY IMPRESSIONS  1. Left ventricular ejection fraction, by estimation, is 60 to 65%. The left ventricle has normal function. The left ventricle has no regional wall motion abnormalities. There is mild concentric left ventricular hypertrophy. Left ventricular diastolic parameters are consistent with Grade I diastolic dysfunction (impaired relaxation).  2. Right ventricular systolic function is normal. The right ventricular size is normal. Tricuspid regurgitation signal is inadequate for assessing PA pressure.  3. The mitral valve is normal in structure. Trivial mitral valve regurgitation. No evidence of mitral stenosis.  4. The aortic valve is tricuspid. There is mild calcification of the aortic valve. There  is mild thickening of the aortic valve. Aortic valve regurgitation is not visualized. Mild aortic valve sclerosis is present, with no evidence of aortic valve stenosis.  5. The inferior vena cava is normal in size with greater than 50% respiratory variability, suggesting right atrial pressure of 3 mmHg. Comparison(s): No significant change from prior study. FINDINGS  Left Ventricle: Left ventricular ejection fraction, by estimation, is 60 to 65%. The left ventricle has normal function. The left ventricle has no regional wall motion abnormalities. The left ventricular internal cavity size was normal in size. There is  mild concentric left ventricular hypertrophy. Left ventricular diastolic parameters are consistent with Grade I diastolic dysfunction (impaired relaxation). Right Ventricle: The right ventricular size is normal. No increase in right ventricular wall thickness. Right ventricular systolic function is normal. Tricuspid regurgitation signal is inadequate for assessing PA pressure. Left Atrium: Left atrial size was normal in size. Right Atrium: Right atrial size was normal in size. Pericardium: There is no evidence of pericardial effusion. Mitral Valve: The mitral valve is normal in structure. There is mild thickening of the mitral valve leaflet(s). There is mild calcification of the mitral valve leaflet(s). Mild mitral annular calcification. Trivial mitral valve regurgitation. No evidence  of mitral valve stenosis. Tricuspid Valve: The tricuspid valve is normal in structure. Tricuspid valve regurgitation is trivial. Aortic Valve: The aortic valve is tricuspid. There is mild calcification of the aortic valve. There is mild thickening  of the aortic valve. Aortic valve regurgitation is not visualized. Mild aortic valve sclerosis is present, with no evidence of aortic valve stenosis. Aortic valve mean gradient measures 3.0 mmHg. Aortic valve peak gradient measures 5.9 mmHg. Aortic valve area, by VTI measures  2.72 cm. Pulmonic Valve: The pulmonic valve was normal in structure. Pulmonic valve regurgitation is trivial. Aorta: The aortic root and ascending aorta are structurally normal, with no evidence of dilitation. Venous: The inferior vena cava is normal in size with greater than 50% respiratory variability, suggesting right atrial pressure of 3 mmHg. IAS/Shunts: No atrial level shunt detected by color flow Doppler.  LEFT VENTRICLE PLAX 2D LVIDd:         3.80 cm  Diastology LVIDs:         2.40 cm  LV e' medial:    4.68 cm/s LV PW:         1.10 cm  LV E/e' medial:  13.2 LV IVS:        1.00 cm  LV e' lateral:   5.98 cm/s LVOT diam:     1.90 cm  LV E/e' lateral: 10.3 LV SV:         68 LV SV Index:   36 LVOT Area:     2.84 cm  RIGHT VENTRICLE             IVC RV Basal diam:  2.40 cm     IVC diam: 0.80 cm RV S prime:     12.80 cm/s LEFT ATRIUM             Index       RIGHT ATRIUM          Index LA diam:        2.80 cm 1.47 cm/m  RA Area:     8.04 cm LA Vol (A2C):   36.6 ml 19.28 ml/m RA Volume:   16.10 ml 8.48 ml/m LA Vol (A4C):   30.4 ml 16.01 ml/m LA Biplane Vol: 33.6 ml 17.70 ml/m  AORTIC VALVE AV Area (Vmax):    2.74 cm AV Area (Vmean):   2.68 cm AV Area (VTI):     2.72 cm AV Vmax:           121.00 cm/s AV Vmean:          85.100 cm/s AV VTI:            0.251 m AV Peak Grad:      5.9 mmHg AV Mean Grad:      3.0 mmHg LVOT Vmax:         117.00 cm/s LVOT Vmean:        80.500 cm/s LVOT VTI:          0.240 m LVOT/AV VTI ratio: 0.96  AORTA Ao Root diam: 3.10 cm Ao Asc diam:  3.00 cm MITRAL VALVE MV Area (PHT): 4.49 cm    SHUNTS MV Decel Time: 169 msec    Systemic VTI:  0.24 m MV E velocity: 61.80 cm/s  Systemic Diam: 1.90 cm MV A velocity: 96.70 cm/s MV E/A ratio:  0.64 Gloria Flatten MD Electronically signed by Gloria Flatten MD Signature Date/Time: 09/20/2020/10:56:24 AM    Final    Disposition   Pt is being discharged home today in good condition.  Follow-up Plans & Appointments     Follow-up  Information    [facility] Follow up on 09/25/2020.   Specialty: Cardiology Why: Nurse visit for BP check Contact information: [address] [place] [address] [phone]       Ellsworth Lennox, PA-C Follow up on 10/18/2020.   Specialties: Physician Assistant, Cardiology Why: at 2:30pm for your follow up appt Contact information: [address] [place] [address] [phone]              Discharge Instructions    Call MD for:  difficulty breathing, headache or visual disturbances   Complete by: As directed    Call MD for:  persistant dizziness or light-headedness   Complete by: As directed    Call MD for:  redness, tenderness, or signs of infection (pain, swelling, redness, odor or green/yellow discharge around incision site)   Complete by: As directed    Diet - low sodium heart healthy   Complete by: As directed    Discharge instructions   Complete by: As directed    Radial Site Care Refer to this sheet in the next few weeks. These instructions provide you with information on caring for yourself after your procedure. Your caregiver may also give you more specific instructions. Your treatment has been planned according to current medical practices, but problems sometimes occur. Call your caregiver if you have any problems or questions after your procedure. HOME CARE INSTRUCTIONS You may shower the day after the procedure.Remove the bandage (dressing) and gently wash the site with plain soap and water.Gently pat the site dry.  Do not apply powder or lotion to the site.  Do not submerge the affected site in water for 3 to 5 days.  Inspect the site at least twice daily.  Do not flex or bend the affected arm for 24 hours.  No lifting over 5 pounds (2.3 kg) for 5 days after your procedure.  Do not drive home if you are discharged the same day of the procedure. Have someone else drive you.  You may drive 24 hours after the procedure unless  otherwise instructed by your caregiver.  What to expect: Any bruising will usually fade within 1 to 2 weeks.  Blood that collects in the tissue (hematoma) may be painful to the touch. It should usually decrease in size and tenderness within 1 to 2 weeks.  SEEK IMMEDIATE MEDICAL CARE IF: You have unusual pain at the radial site.  You have redness, warmth, swelling, or pain at the radial site.  You have drainage (other than a small amount of blood on the dressing).  You have chills.  You have a fever or persistent symptoms for more than 72 hours.  You have a fever and your symptoms suddenly get worse.  Your arm becomes pale, cool, tingly, or numb.  You have heavy bleeding from the site. Hold pressure on the site.   Increase activity slowly   Complete by: As directed       Discharge Medications   Allergies as of 09/20/2020      Reactions   Lisinopril Swelling   Losartan    Nausea, Headache   Other    States allergies to multiple antibiotics but NAMES UNKNOWN.   Polytrim [polymyxin B-trimethoprim]    Burning eyes, shortness of breath, chest pain   Spironolactone    Dizziness, Nausea   Codeine Palpitations      Medication List    STOP taking these medications   amLODipine 2.5 MG tablet Commonly known as: NORVASC   esomeprazole 20 MG capsule Commonly known as: NEXIUM Replaced by: pantoprazole 40 MG tablet     TAKE these medications  aspirin EC 81 MG tablet Take 1 tablet (81 mg total) by mouth daily.   atorvastatin 80 MG tablet Commonly known as: LIPITOR Take 1 tablet (80 mg total) by mouth daily. What changed: how much to take   clopidogrel 75 MG tablet Commonly known as: PLAVIX Take 1 tablet (75 mg total) by mouth daily.   diphenhydrAMINE 25 MG tablet Commonly known as: BENADRYL Take 1 tablet (25 mg total) by mouth every 6 (six) hours as needed for allergies.   famotidine 20 MG tablet Commonly known as: PEPCID Take 1 tablet (20 mg total) by mouth 2 (two)  times daily.   hydrALAZINE 25 MG tablet Commonly known as: APRESOLINE Take 1 tablet (25 mg total) by mouth every 8 (eight) hours.   metoprolol succinate 100 MG 24 hr tablet Commonly known as: TOPROL-XL Take 1 tablet (100 mg total) by mouth daily. Take with or immediately following a meal.   nitroGLYCERIN 0.4 MG SL tablet Commonly known as: Nitrostat Place 1 tablet (0.4 mg total) under the tongue every 5 (five) minutes as needed. What changed:   when to take this  reasons to take this   pantoprazole 40 MG tablet Commonly known as: PROTONIX Take 1 tablet (40 mg total) by mouth daily. Replaces: esomeprazole 20 MG capsule          Outstanding Labs/Studies   N/a   Duration of Discharge Encounter   Greater than 30 minutes including physician time.  Signed, Laverda Page, NP 09/20/2020, 12:24 PM  I have personally seen and examined this patient. I agree with the assessment and plan as outlined above.  Cardiac cath reviewed. No culprit lesions.  Will continue current therapy. Discharge home today.   Verne Carrow 09/20/2020 12:24 PM

## 2020-09-25 ENCOUNTER — Ambulatory Visit: Payer: PPO

## 2020-10-18 ENCOUNTER — Ambulatory Visit: Payer: PPO | Admitting: Student

## 2020-12-16 NOTE — Progress Notes (Signed)
Cardiology Office Note    Date:  12/17/2020   ID:  Gloria Thompson, DOB 1955-09-17, MRN [id number]  PCP:  Patient, No Pcp Per (Inactive)  Cardiologist: Nona Dell, MD    Chief Complaint  Patient presents with   Hospitalization Follow-up    History of Present Illness:    Gloria Thompson is a 65 y.o. female with past medical history of CAD (s/p STEMI in 07/2019 with DES to LAD), HFimpEF (EF 25% by echo in 07/2019, normalized to 55-60% by repeat echo in 02/2020), palpitations (monitor in 03/2020 showing PAC's PVC's and brief SVT), HTN and HLD who presents to the office today for overdue follow-up.   She was most recently admitted to [facility] in 09/2020 for evaluation of chest pain which had awoke her from sleep. Hs Troponin values were negative and her EKG was without ST changes. Given concerns for unstable angina, she was transferred to [facility] for a cardiac catheterization. This showed a widely patent LAD stent with only 50% 1st Mrg stenosis and no culprit lesion identified. Repeat echo showed a preserved EF of 60-65% with no regional WMA and no significant valve abnormalities. She did have scheduled follow-up on 10/18/2020 but canceled the visit.   In talking with the patient today, she reports having stopped Hydralazine following her hospital discharge and she felt like this was causing chest pain. She has only been on Toprol-XL 100 mg daily in the interim and BP has been elevated intermittently when checked at home. She does experience occasional brief episodes of chest pain but no persistent symptoms. Her breathing has overall been stable with no recent orthopnea, PND or pitting edema. She is trying to follow a low-sodium diet and tries to keep her sodium intake to less than 1500 mg daily.   Past Medical History:  Diagnosis Date   Acid reflux    CAD (coronary artery disease)    a. LHC 08/10/19: 100% of prox to mid LAD s/p DES, 60% of 1st Mrgb. 09/2020: cath showing patent  LAD stent with 50% 1st Mrg stenosis   Essential hypertension    History of cardiomyopathy    Normalization of LVEF 02/2020   Hyperlipidemia     Past Surgical History:  Procedure Laterality Date   APPENDECTOMY     CESAREAN SECTION  [id number]   CORONARY/GRAFT ACUTE MI REVASCULARIZATION N/A 08/10/2019   Procedure: Coronary/Graft Acute MI Revascularization;  Surgeon: Lennette Bihari, MD;  Location: [facility] INVASIVE CV LAB;  Service: Cardiovascular;  Laterality: N/A;   kidney stones     LEFT HEART CATH AND CORONARY ANGIOGRAPHY N/A 08/10/2019   Procedure: LEFT HEART CATH AND CORONARY ANGIOGRAPHY;  Surgeon: Lennette Bihari, MD;  Location: [facility] INVASIVE CV LAB;  Service: Cardiovascular;  Laterality: N/A;   LEFT HEART CATH AND CORONARY ANGIOGRAPHY N/A 09/19/2020   Procedure: LEFT HEART CATH AND CORONARY ANGIOGRAPHY;  Surgeon: Runell Gess, MD;  Location: [facility] INVASIVE CV LAB;  Service: Cardiovascular;  Laterality: N/A;    Current Medications: Outpatient Medications Prior to Visit  Medication Sig Dispense Refill   aspirin EC 81 MG tablet Take 1 tablet (81 mg total) by mouth daily. 30 tablet 6   clopidogrel (PLAVIX) 75 MG tablet Take 1 tablet (75 mg total) by mouth daily. 90 tablet 3   diphenhydrAMINE (BENADRYL) 25 MG tablet Take 1 tablet (25 mg total) by mouth every 6 (six) hours as needed for allergies. 20 tablet 0   famotidine (PEPCID) 20 MG tablet Take 1  tablet (20 mg total) by mouth 2 (two) times daily. 30 tablet 0   metoprolol succinate (TOPROL-XL) 100 MG 24 hr tablet Take 1 tablet (100 mg total) by mouth daily. Take with or immediately following a meal. 90 tablet 3   nitroGLYCERIN (NITROSTAT) 0.4 MG SL tablet Place 1 tablet (0.4 mg total) under the tongue every 5 (five) minutes as needed. (Patient taking differently: Place 0.4 mg under the tongue every 5 (five) minutes x 3 doses as needed for chest pain (if no relief after 2nd dose, proceed to the ED for an evaluation or call [phone]).) 25 tablet 12    pantoprazole (PROTONIX) 40 MG tablet Take 1 tablet (40 mg total) by mouth daily. 30 tablet 1   atorvastatin (LIPITOR) 80 MG tablet Take 1 tablet (80 mg total) by mouth daily. 30 tablet 6   hydrALAZINE (APRESOLINE) 25 MG tablet Take 1 tablet (25 mg total) by mouth every 8 (eight) hours. 90 tablet 0   No facility-administered medications prior to visit.     Allergies:   Amlodipine, Hydralazine, Lisinopril, Losartan, Other, Polytrim [polymyxin b-trimethoprim], Spironolactone, and Codeine   Social History   Socioeconomic History   Marital status: Widowed    Spouse name: Not on file   Number of children: Not on file   Years of education: Not on file   Highest education level: Not on file  Occupational History   Not on file  Tobacco Use   Smoking status: Never   Smokeless tobacco: Never  Vaping Use   Vaping Use: Never used  Substance and Sexual Activity   Alcohol use: No   Drug use: No   Sexual activity: Not on file  Other Topics Concern   Not on file  Social History Narrative   Not on file   Social Determinants of Health   Financial Resource Strain: Not on file  Food Insecurity: Not on file  Transportation Needs: Not on file  Physical Activity: Not on file  Stress: Not on file  Social Connections: Not on file     Family History:  The patient's family history includes COPD in her sister; Diabetes in her sister; Heart attack in her father; Heart disease in her brother, brother, brother, brother, and mother; Hyperlipidemia in her sister and sister; Hypertension in her brother, brother, brother, brother, sister, and sister.   Review of Systems:    Please see the history of present illness.     All other systems reviewed and are otherwise negative except as noted above.   Physical Exam:    VS:  BP (!) 156/82   Pulse 78   Ht 5\' 4"  (1.626 m)   Wt 187 lb 12.8 oz (85.2 kg)   SpO2 94%   BMI 32.24 kg/m    General: Well developed, well nourished,female appearing in no  acute distress. Head: Normocephalic, atraumatic. Neck: No carotid bruits. JVD not elevated.  Lungs: Respirations regular and unlabored, without wheezes or rales.  Heart: Regular rate and rhythm. No S3 or S4.  No murmur, no rubs, or gallops appreciated. Abdomen: Appears non-distended. No obvious abdominal masses. Msk:  Strength and tone appear normal for age. No obvious joint deformities or effusions. Extremities: No clubbing or cyanosis. No pitting edema.  Distal pedal pulses are 2+ bilaterally. Neuro: Alert and oriented X 3. Moves all extremities spontaneously. No focal deficits noted. Psych:  Responds to questions appropriately with a normal affect. Skin: No rashes or lesions noted  Wt Readings from Last 3 Encounters:  12/17/20 187 lb 12.8 oz (85.2 kg)  09/20/20 186 lb 6.4 oz (84.6 kg)  09/13/20 187 lb (84.8 kg)     Studies/Labs Reviewed:   EKG:  EKG is not ordered today.    Recent Labs: 02/28/2020: ALT 20 09/20/2020: BUN 10; Creatinine, Ser 0.93; Hemoglobin 14.1; Platelets 254; Potassium 4.3; Sodium 136   Lipid Panel    Component Value Date/Time   CHOL 153 09/20/2020 0405   TRIG 83 09/20/2020 0405   HDL 48 09/20/2020 0405   CHOLHDL 3.2 09/20/2020 0405   VLDL 17 09/20/2020 0405   LDLCALC 88 09/20/2020 0405    Additional studies/ records that were reviewed today include:   Echocardiogram: 09/2020 IMPRESSIONS     1. Left ventricular ejection fraction, by estimation, is 60 to 65%. The  left ventricle has normal function. The left ventricle has no regional  wall motion abnormalities. There is mild concentric left ventricular  hypertrophy. Left ventricular diastolic  parameters are consistent with Grade I diastolic dysfunction (impaired  relaxation).   2. Right ventricular systolic function is normal. The right ventricular  size is normal. Tricuspid regurgitation signal is inadequate for assessing  PA pressure.   3. The mitral valve is normal in structure. Trivial  mitral valve  regurgitation. No evidence of mitral stenosis.   4. The aortic valve is tricuspid. There is mild calcification of the  aortic valve. There is mild thickening of the aortic valve. Aortic valve  regurgitation is not visualized. Mild aortic valve sclerosis is present,  with no evidence of aortic valve  stenosis.   5. The inferior vena cava is normal in size with greater than 50%  respiratory variability, suggesting right atrial pressure of 3 mmHg.    Cardiac Catheterization: 09/2020 1st Mrg lesion is 50% stenosed. Previously placed Prox LAD to Mid LAD stent (unknown type) is widely patent. IMPRESSION:Ms Geck was transferred from [facility] for chest pain that awakened her from sleep.  She is status post LAD stenting a little over a year ago with an excellent result by Dr. Tresa Endo.  Her stent is widely patent.  She has no other disease.  There is no "culprit lesion".  She will be treated for noncardiac chest pain.  The sheath was removed and a TR band was placed on the right wrist to achieve patent hemostasis.  The patient left the lab in stable condition.  Assessment:    1. Coronary artery disease involving native coronary artery of native heart without angina pectoris   2. Ischemic cardiomyopathy   3. Palpitations   4. Essential hypertension   5. Hyperlipidemia LDL goal <70      Plan:   In order of problems listed above:  1. CAD - She is s/p STEMI in 07/2019 with DES to LAD and recent cath in 09/2020 showed a patent stent with only 50% 1st Mrg stenosis. - She experiences occasional episodes of chest discomfort but nothing as severe as her prior angina. Has not utilized SL NTG recently.  - Continue ASA 81 mg daily, Atorvastatin 80 mg daily, Plavix 75 mg daily and Toprol-XL 100 mg daily.  2. HFimpEF - Her EF was at 25% by echo in 07/2019, normalized to 55-60% by repeat echo in 02/2020. Repeat echocardiogram in 09/2020 showed her EF remained stable at 60 to 65%.    - She denies any recent orthopnea, PND or lower extremity edema and appears euvolemic by examination today. Continue Toprol-XL 100 mg daily.    3. Palpitations -  Prior monitor in 03/2020 showed PAC's PVC's and brief SVT. Her symptoms have overall been well controlled on Toprol-XL 100 mg daily.  4. HTN - Her BP has been elevated when checked at home and was initially recorded at 160/100 during today's visit, rechecked and improved to 156/82. She has been intolerant to Lisinopril, Losartan and Spironolactone in the past. Reports self discontinuing Hydralazine as this caused chest pain. Was previously on Norvasc many years ago and reported tolerating this well but was restarted on it earlier this year and developed chest pain with Amlodipine. I reviewed with her that this is an atypical side effect of the medication and she is open to trying a similar medication given that she tolerated it well in the past. Will start Nifedipine 30 mg daily which can be titrated if she tolerates this well. Switching to Chlorthalidone would also be an option in the future.   5. HLD - FLP in 09/2020 showed her LDL had improved to 88. She remains on Atorvastatin 80 mg daily. If LDL remains above goal over time, would consider switching to Crestor 40 mg daily or adding Zetia.    Medication Adjustments/Labs and Tests Ordered: Current medicines are reviewed at length with the patient today.  Concerns regarding medicines are outlined above.  Medication changes, Labs and Tests ordered today are listed in the Patient Instructions below. Patient Instructions  Medication Instructions:  Your physician has recommended you make the following change in your medication:  START ProCardia 30 mg daily  *If you need a refill on your cardiac medications before your next appointment, please call your pharmacy*   Lab Work: None If you have labs (blood work) drawn today and your tests are completely normal, you will receive your  results only by: MyChart Message (if you have MyChart) OR A paper copy in the mail If you have any lab test that is abnormal or we need to change your treatment, we will call you to review the results.   Testing/Procedures: None   Follow-Up: At [facility], you and your health needs are our priority.  As part of our continuing mission to provide you with exceptional heart care, we have created designated Provider Care Teams.  These Care Teams include your primary Cardiologist (physician) and Advanced Practice Providers (APPs -  Physician Assistants and Nurse Practitioners) who all work together to provide you with the care you need, when you need it.  We recommend signing up for the patient portal called "MyChart".  Sign up information is provided on this After Visit Summary.  MyChart is used to connect with patients for Virtual Visits (Telemedicine).  Patients are able to view lab/test results, encounter notes, upcoming appointments, etc.  Non-urgent messages can be sent to your provider as well.   To learn more about what you can do with MyChart, go to [email or web address].    Your next appointment:   6 month(s)  The format for your next appointment:   In Person  Provider:   Nona DellSamuel McDowell, MD   Other Instructions     Signed, Ellsworth LennoxBrittany M Rawad Bochicchio, PA-C  12/17/2020 2:40 PM    [facility] [address] [place], [place] [address] Phone: [phone]) [phone] Fax: [phone]

## 2020-12-17 ENCOUNTER — Ambulatory Visit: Payer: PPO | Admitting: Student

## 2020-12-17 ENCOUNTER — Encounter: Payer: Self-pay | Admitting: Student

## 2020-12-17 ENCOUNTER — Other Ambulatory Visit: Payer: Self-pay

## 2020-12-17 VITALS — BP 156/82 | HR 78 | Ht 64.0 in | Wt 187.8 lb

## 2020-12-17 DIAGNOSIS — I255 Ischemic cardiomyopathy: Secondary | ICD-10-CM | POA: Diagnosis not present

## 2020-12-17 DIAGNOSIS — E785 Hyperlipidemia, unspecified: Secondary | ICD-10-CM

## 2020-12-17 DIAGNOSIS — R002 Palpitations: Secondary | ICD-10-CM | POA: Diagnosis not present

## 2020-12-17 DIAGNOSIS — I10 Essential (primary) hypertension: Secondary | ICD-10-CM | POA: Diagnosis not present

## 2020-12-17 DIAGNOSIS — I2510 Atherosclerotic heart disease of native coronary artery without angina pectoris: Secondary | ICD-10-CM | POA: Diagnosis not present

## 2020-12-17 MED ORDER — NIFEDIPINE ER OSMOTIC RELEASE 30 MG PO TB24: 30.0000 mg | ORAL_TABLET | Freq: Every day | ORAL | 5 refills | Status: DC

## 2020-12-17 MED ORDER — ATORVASTATIN CALCIUM 80 MG PO TABS: 80.0000 mg | ORAL_TABLET | Freq: Every day | ORAL | 3 refills | Status: DC

## 2020-12-17 NOTE — Patient Instructions (Signed)
Medication Instructions:  Your physician has recommended you make the following change in your medication:  START ProCardia 30 mg daily  *If you need a refill on your cardiac medications before your next appointment, please call your pharmacy*   Lab Work: None If you have labs (blood work) drawn today and your tests are completely normal, you will receive your results only by: MyChart Message (if you have MyChart) OR A paper copy in the mail If you have any lab test that is abnormal or we need to change your treatment, we will call you to review the results.   Testing/Procedures: None   Follow-Up: At [facility], you and your health needs are our priority.  As part of our continuing mission to provide you with exceptional heart care, we have created designated Provider Care Teams.  These Care Teams include your primary Cardiologist (physician) and Advanced Practice Providers (APPs -  Physician Assistants and Nurse Practitioners) who all work together to provide you with the care you need, when you need it.  We recommend signing up for the patient portal called "MyChart".  Sign up information is provided on this After Visit Summary.  MyChart is used to connect with patients for Virtual Visits (Telemedicine).  Patients are able to view lab/test results, encounter notes, upcoming appointments, etc.  Non-urgent messages can be sent to your provider as well.   To learn more about what you can do with MyChart, go to [email or web address].    Your next appointment:   6 month(s)  The format for your next appointment:   In Person  Provider:   Nona Dell, MD   Other Instructions

## 2021-04-24 ENCOUNTER — Telehealth: Payer: Self-pay | Admitting: Cardiology

## 2021-04-24 NOTE — Telephone Encounter (Signed)
Spoke with [employer] and they state that the manufacture of the medication did change. Pt denies dizziness at this time. She reports that CP is same as felt at last office visit. Encouraged pt to take Toprol XL as prescribed and monitor BP and Hr. Pt will call back if she continues to have symptoms.

## 2021-04-24 NOTE — Telephone Encounter (Signed)
Pt c/o medication issue:  1. Name of Medication:  metoprolol succinate (TOPROL-XL) 100 MG 24 hr tablet  2. How are you currently taking this medication (dosage and times per day)? Takes 1/2 a tablet in the morning and a 1/2 a tablet at night   3. Are you having a reaction (difficulty breathing--STAT)? Yes  4. What is your medication issue? Gloria Thompson is calling stating ever since she picked her refill of this medication about a half a month ago the tablet looked bigger than her previous prescriptions. Since taking the medication she has been having symptoms of CP that comes and goes, headaches, eye and stomach pain, as well as feeling nervous and jittery. She reports she takes this medication a half a tablet in the morning and a half a tablet at night due to it causing her to get sick if she takes a full tablet at once. She reports there has been days she does not take the medication and the symptoms lessen, but then her BP begins to rise so she has to take it again. Gloria Thompson did not report any CP at the time of the call.    ?

## 2021-05-12 ENCOUNTER — Other Ambulatory Visit: Payer: Self-pay | Admitting: Cardiology

## 2021-07-01 ENCOUNTER — Telehealth: Payer: Self-pay | Admitting: Cardiology

## 2021-07-01 MED ORDER — METOPROLOL SUCCINATE ER 100 MG PO TB24: ORAL_TABLET | ORAL | 0 refills | Status: DC

## 2021-07-01 NOTE — Telephone Encounter (Signed)
Refilled, placed in recalls ?

## 2021-07-01 NOTE — Telephone Encounter (Signed)
?*  STAT* If patient is at the pharmacy, call can be transferred to refill team. ? ? ?1. Which medications need to be refilled? (please list name of each medication and dose if known)  ?metoprolol succinate (TOPROL-XL) 100 MG 24 hr tablet ? ?2. Which pharmacy/location (including street and city if local pharmacy) is medication to be sent to? ?[place], [place] - [id number] ? ?3. Do they need a 30 day or 90 day supply?  ?90 day supply ? ?

## 2021-08-26 NOTE — Progress Notes (Signed)
Cardiology Office Note    Date:  09/09/2021   ID:  Gloria Thompson, DOB 12-10-1955, MRN [id number]   PCP:  Collier Bullock   [facility]  Cardiologist:  Nona Dell, MD   Advanced Practice Provider:  No care team member to display Electrophysiologist:  None   [id number]   Chief Complaint  Patient presents with   Follow-up    History of Present Illness:  Gloria Thompson is a 66 y.o. female  with past medical history of CAD (s/p STEMI in 07/2019 with DES to LAD), HFimpEF (EF 25% by echo in 07/2019, normalized to 55-60% by repeat echo in 02/2020), palpitations (monitor in 03/2020 showing PAC's PVC's and brief SVT), HTN and HLD   Cath 09/2020 for chest pain widely patent LAD stent and 50% OM1. Echo EF 60-65% no WMA.  Last saw Ms. Strader PA-C 11/2020 and stopped her hydralazine as she thought it was causing her chest pain. BP was running high. Intolerant to lisinopril, losartan, spiro, amlodipine. She started nifedipine 30 mg daily and consider chlorthalidone.   Patient come in for f/u. She says her BP continues to run high 150/90. Denies chest pain, dyspnea, palpitations, edema. She walks a mile daily. Complains of excess thirst. Has intentionally lost 10 lbs.  Past Medical History:  Diagnosis Date   Acid reflux    CAD (coronary artery disease)    a. LHC 08/10/19: 100% of prox to mid LAD s/p DES, 60% of 1st Mrgb. 09/2020: cath showing patent LAD stent with 50% 1st Mrg stenosis   Essential hypertension    History of cardiomyopathy    Normalization of LVEF 02/2020   Hyperlipidemia     Past Surgical History:  Procedure Laterality Date   APPENDECTOMY     CESAREAN SECTION  [id number]   CORONARY/GRAFT ACUTE MI REVASCULARIZATION N/A 08/10/2019   Procedure: Coronary/Graft Acute MI Revascularization;  Surgeon: Lennette Bihari, MD;  Location: [facility] INVASIVE CV LAB;  Service: Cardiovascular;  Laterality: N/A;   kidney stones     LEFT HEART CATH AND  CORONARY ANGIOGRAPHY N/A 08/10/2019   Procedure: LEFT HEART CATH AND CORONARY ANGIOGRAPHY;  Surgeon: Lennette Bihari, MD;  Location: [facility] INVASIVE CV LAB;  Service: Cardiovascular;  Laterality: N/A;   LEFT HEART CATH AND CORONARY ANGIOGRAPHY N/A 09/19/2020   Procedure: LEFT HEART CATH AND CORONARY ANGIOGRAPHY;  Surgeon: Runell Gess, MD;  Location: [facility] INVASIVE CV LAB;  Service: Cardiovascular;  Laterality: N/A;    Current Medications: Current Meds  Medication Sig   aspirin EC 81 MG tablet Take 1 tablet (81 mg total) by mouth daily.   chlorthalidone (HYGROTON) 25 MG tablet Take 1 tablet (25 mg total) by mouth daily.   diphenhydrAMINE (BENADRYL) 25 MG tablet Take 1 tablet (25 mg total) by mouth every 6 (six) hours as needed for allergies.   famotidine (PEPCID) 20 MG tablet Take 1 tablet (20 mg total) by mouth 2 (two) times daily.   nitroGLYCERIN (NITROSTAT) 0.4 MG SL tablet Place 1 tablet (0.4 mg total) under the tongue every 5 (five) minutes as needed. (Patient taking differently: Place 0.4 mg under the tongue every 5 (five) minutes x 3 doses as needed for chest pain (if no relief after 2nd dose, proceed to the ED for an evaluation or call [phone]).)   pantoprazole (PROTONIX) 40 MG tablet Take 1 tablet (40 mg total) by mouth daily.   [DISCONTINUED] atorvastatin (LIPITOR) 80 MG tablet Take 1 tablet (80 mg  total) by mouth daily.   [DISCONTINUED] metoprolol succinate (TOPROL-XL) 100 MG 24 hr tablet TAKE 1 TABLET BY MOUTH ONCE DAILY WITH OR IMMEDIATELY FOLLOWING A MEAL   [DISCONTINUED] NIFEdipine (PROCARDIA-XL/NIFEDICAL-XL) 30 MG 24 hr tablet Take 1 tablet (30 mg total) by mouth daily.     Allergies:   Amlodipine, Hydralazine, Lisinopril, Losartan, Other, Polytrim [polymyxin b-trimethoprim], Spironolactone, and Codeine   Social History   Socioeconomic History   Marital status: Widowed    Spouse name: Not on file   Number of children: Not on file   Years of education: Not on file   Highest  education level: Not on file  Occupational History   Not on file  Tobacco Use   Smoking status: Never   Smokeless tobacco: Never  Vaping Use   Vaping Use: Never used  Substance and Sexual Activity   Alcohol use: No   Drug use: No   Sexual activity: Not on file  Other Topics Concern   Not on file  Social History Narrative   Not on file   Social Determinants of Health   Financial Resource Strain: Not on file  Food Insecurity: Not on file  Transportation Needs: Not on file  Physical Activity: Not on file  Stress: Not on file  Social Connections: Not on file     Family History:  The patient's  family history includes COPD in her sister; Diabetes in her sister; Heart attack in her father; Heart disease in her brother, brother, brother, brother, and mother; Hyperlipidemia in her sister and sister; Hypertension in her brother, brother, brother, brother, sister, and sister.   ROS:   Please see the history of present illness.    ROS All other systems reviewed and are negative.   PHYSICAL EXAM:   VS:  BP 140/90   Pulse 79   Ht 5\' 4"  (1.626 m)   Wt 177 lb 9.6 oz (80.6 kg)   SpO2 97%   BMI 30.48 kg/m   Physical Exam  GEN: Well nourished, well developed, in no acute distress  Neck: no JVD, carotid bruits, or masses Cardiac:RRR; no murmurs, rubs, or gallops  Respiratory:  clear to auscultation bilaterally, normal work of breathing GI: soft, nontender, nondistended, + BS Ext: without cyanosis, clubbing, or edema, Good distal pulses bilaterally Neuro:  Alert and Oriented x 3, Psych: euthymic mood, full affect  Wt Readings from Last 3 Encounters:  09/09/21 177 lb 9.6 oz (80.6 kg)  12/17/20 187 lb 12.8 oz (85.2 kg)  09/20/20 186 lb 6.4 oz (84.6 kg)      Studies/Labs Reviewed:   EKG:  EKG is  ordered today.  The ekg ordered today demonstrates NSR with poor R wave progression ant  Recent Labs: 09/20/2020: BUN 10; Creatinine, Ser 0.93; Hemoglobin 14.1; Platelets 254;  Potassium 4.3; Sodium 136   Lipid Panel    Component Value Date/Time   CHOL 153 09/20/2020 0405   TRIG 83 09/20/2020 0405   HDL 48 09/20/2020 0405   CHOLHDL 3.2 09/20/2020 0405   VLDL 17 09/20/2020 0405   LDLCALC 88 09/20/2020 0405    Additional studies/ records that were reviewed today include:   Echocardiogram: 09/2020 IMPRESSIONS     1. Left ventricular ejection fraction, by estimation, is 60 to 65%. The  left ventricle has normal function. The left ventricle has no regional  wall motion abnormalities. There is mild concentric left ventricular  hypertrophy. Left ventricular diastolic  parameters are consistent with Grade I diastolic dysfunction (impaired  relaxation).  2. Right ventricular systolic function is normal. The right ventricular  size is normal. Tricuspid regurgitation signal is inadequate for assessing  PA pressure.   3. The mitral valve is normal in structure. Trivial mitral valve  regurgitation. No evidence of mitral stenosis.   4. The aortic valve is tricuspid. There is mild calcification of the  aortic valve. There is mild thickening of the aortic valve. Aortic valve  regurgitation is not visualized. Mild aortic valve sclerosis is present,  with no evidence of aortic valve  stenosis.   5. The inferior vena cava is normal in size with greater than 50%  respiratory variability, suggesting right atrial pressure of 3 mmHg.      Cardiac Catheterization: 09/2020 1st Mrg lesion is 50% stenosed. Previously placed Prox LAD to Mid LAD stent (unknown type) is widely patent. IMPRESSION:Ms Thayne was transferred from [facility] for chest pain that awakened her from sleep.  She is status post LAD stenting a little over a year ago with an excellent result by Dr. Tresa Endo.  Her stent is widely patent.  She has no other disease.  There is no "culprit lesion".  She will be treated for noncardiac chest pain.  The sheath was removed and a TR band was placed on the right  wrist to achieve patent hemostasis.  The patient left the lab in stable condition.     Risk Assessment/Calculations:         ASSESSMENT:    1. Coronary artery disease involving native coronary artery of native heart without angina pectoris   2. Heart failure with preserved ejection fraction, unspecified HF chronicity (HCC)   3. Palpitations   4. SVT (supraventricular tachycardia) (HCC)   5. Essential hypertension   6. Hyperlipidemia, unspecified hyperlipidemia type   7. Excessive thirst      PLAN:  In order of problems listed above:  CAD s/p STEMI in 07/2019 with DES to LAD and recent cath in 09/2020 showed a patent stent with only 50% 1st Mrg stenosis. No angina, on ASA and Plavix  HFimpEF EF was at 25% by echo in 07/2019, normalized to 55-60% by repeat echo in 02/2020. Repeat echocardiogram in 09/2020 showed her EF remained stable at 60 to 65%, no CHF on exam  Palpitations with monitor 03/2020 showed PAC's PVC's and brief SVT. Her symptoms have overall been well controlled on Toprol-XL 100 mg daily.  HTN with multiple medication intolerances as listed above. BP continues to run high-will add chlorthalidone 25 mg once daily. Check labs today. She restricts salt 1500 mg daily.  HLD on lipitor check labs today  Excessive thirst check A1C and recommend she re-establish  with PCP     Shared Decision Making/Informed Consent        Medication Adjustments/Labs and Tests Ordered: Current medicines are reviewed at length with the patient today.  Concerns regarding medicines are outlined above.  Medication changes, Labs and Tests ordered today are listed in the Patient Instructions below. Patient Instructions  Medication Instructions:  Your physician has recommended you make the following change in your medication:   Start Taking Chlorthalidone 25 mg Daily   *If you need a refill on your cardiac medications before your next appointment, please call your pharmacy*   Lab  Work: Your physician recommends that you return for lab work Today   If you have labs (blood work) drawn today and your tests are completely normal, you will receive your results only by: MyChart Message (if you have MyChart) OR  A paper copy in the mail If you have any lab test that is abnormal or we need to change your treatment, we will call you to review the results.   Testing/Procedures: NONE   Follow-Up: At [facility], you and your health needs are our priority.  As part of our continuing mission to provide you with exceptional heart care, we have created designated Provider Care Teams.  These Care Teams include your primary Cardiologist (physician) and Advanced Practice Providers (APPs -  Physician Assistants and Nurse Practitioners) who all work together to provide you with the care you need, when you need it.  We recommend signing up for the patient portal called "MyChart".  Sign up information is provided on this After Visit Summary.  MyChart is used to connect with patients for Virtual Visits (Telemedicine).  Patients are able to view lab/test results, encounter notes, upcoming appointments, etc.  Non-urgent messages can be sent to your provider as well.   To learn more about what you can do with MyChart, go to [email or web address].    Your next appointment:   6 month(s)  The format for your next appointment:   In Person  Provider:   Nona Dell, MD    Other Instructions Thank you for choosing [facility]!    Important Information About Sugar         Elson Clan, PA-C  09/09/2021 12:03 PM    [facility] [address] [place], [place], [place]  [address] Phone: [phone]; Fax: [phone]

## 2021-09-09 ENCOUNTER — Other Ambulatory Visit (HOSPITAL_COMMUNITY)
Admission: RE | Admit: 2021-09-09 | Discharge: 2021-09-09 | Disposition: A | Discharge status: Home / Self Care | Payer: PPO | Source: Ambulatory Visit | Attending: Physician Assistant | Admitting: Physician Assistant

## 2021-09-09 ENCOUNTER — Ambulatory Visit: Payer: PPO | Admitting: Physician Assistant

## 2021-09-09 ENCOUNTER — Encounter: Payer: Self-pay | Admitting: Physician Assistant

## 2021-09-09 VITALS — BP 140/90 | HR 79 | Ht 64.0 in | Wt 177.6 lb

## 2021-09-09 DIAGNOSIS — R002 Palpitations: Secondary | ICD-10-CM | POA: Diagnosis not present

## 2021-09-09 DIAGNOSIS — R631 Polydipsia: Secondary | ICD-10-CM | POA: Insufficient documentation

## 2021-09-09 DIAGNOSIS — I471 Supraventricular tachycardia, unspecified: Secondary | ICD-10-CM

## 2021-09-09 DIAGNOSIS — I10 Essential (primary) hypertension: Secondary | ICD-10-CM | POA: Diagnosis present

## 2021-09-09 DIAGNOSIS — I5030 Unspecified diastolic (congestive) heart failure: Secondary | ICD-10-CM | POA: Diagnosis not present

## 2021-09-09 DIAGNOSIS — E785 Hyperlipidemia, unspecified: Secondary | ICD-10-CM | POA: Diagnosis not present

## 2021-09-09 DIAGNOSIS — I2510 Atherosclerotic heart disease of native coronary artery without angina pectoris: Secondary | ICD-10-CM

## 2021-09-09 LAB — TSH: TSH: 1.057 u[IU]/mL (ref 0.350–4.500)

## 2021-09-09 LAB — COMPREHENSIVE METABOLIC PANEL
ALT: 39 U/L (ref 0–44)
AST: 44 U/L — ABNORMAL HIGH (ref 15–41)
Albumin: 4 g/dL (ref 3.5–5.0)
Alkaline Phosphatase: 98 U/L (ref 38–126)
Anion gap: 6 (ref 5–15)
BUN: 17 mg/dL (ref 8–23)
CO2: 28 mmol/L (ref 22–32)
Calcium: 9.3 mg/dL (ref 8.9–10.3)
Chloride: 106 mmol/L (ref 98–111)
Creatinine, Ser: 0.96 mg/dL (ref 0.44–1.00)
GFR, Estimated: >60 mL/min (ref >60)
Glucose, Bld: 103 mg/dL — ABNORMAL HIGH (ref 70–99)
Potassium: 4.6 mmol/L (ref 3.5–5.1)
Sodium: 140 mmol/L (ref 135–145)
Total Bilirubin: 0.6 mg/dL (ref 0.3–1.2)
Total Protein: 7.3 g/dL (ref 6.5–8.1)

## 2021-09-09 LAB — CBC
HCT: 41.4 % (ref 36.0–46.0)
Hemoglobin: 14.3 g/dL (ref 12.0–15.0)
MCH: 32.4 pg (ref 26.0–34.0)
MCHC: 34.5 g/dL (ref 30.0–36.0)
MCV: 93.9 fL (ref 80.0–100.0)
Platelets: 235 10*3/uL (ref 150–400)
RBC: 4.41 MIL/uL (ref 3.87–5.11)
RDW: 11.7 % (ref 11.5–15.5)
WBC: 6.4 10*3/uL (ref 4.0–10.5)
nRBC: 0 % (ref 0.0–0.2)

## 2021-09-09 LAB — HEMOGLOBIN A1C
Hgb A1c MFr Bld: 5.3 % (ref 4.8–5.6)
Mean Plasma Glucose: 105.41 mg/dL

## 2021-09-09 LAB — LIPID PANEL
Cholesterol: 116 mg/dL (ref 0–200)
HDL: 38 mg/dL — ABNORMAL LOW (ref >40)
LDL Cholesterol: 49 mg/dL (ref 0–99)
Total CHOL/HDL Ratio: 3.1 RATIO
Triglycerides: 147 mg/dL (ref <150)
VLDL: 29 mg/dL (ref 0–40)

## 2021-09-09 MED ORDER — ATORVASTATIN CALCIUM 80 MG PO TABS: 80.0000 mg | ORAL_TABLET | Freq: Every day | ORAL | 3 refills | Status: DC

## 2021-09-09 MED ORDER — METOPROLOL SUCCINATE ER 100 MG PO TB24: ORAL_TABLET | ORAL | 3 refills | Status: DC

## 2021-09-09 MED ORDER — NIFEDIPINE ER OSMOTIC RELEASE 30 MG PO TB24: 30.0000 mg | ORAL_TABLET | Freq: Every day | ORAL | 3 refills | Status: DC

## 2021-09-09 MED ORDER — CHLORTHALIDONE 25 MG PO TABS: 25.0000 mg | ORAL_TABLET | Freq: Every day | ORAL | 3 refills | Status: DC

## 2021-09-09 NOTE — Patient Instructions (Signed)
Medication Instructions:  Your physician has recommended you make the following change in your medication:   Start Taking Chlorthalidone 25 mg Daily   *If you need a refill on your cardiac medications before your next appointment, please call your pharmacy*   Lab Work: Your physician recommends that you return for lab work Today   If you have labs (blood work) drawn today and your tests are completely normal, you will receive your results only by: MyChart Message (if you have MyChart) OR A paper copy in the mail If you have any lab test that is abnormal or we need to change your treatment, we will call you to review the results.   Testing/Procedures: NONE   Follow-Up: At [facility], you and your health needs are our priority.  As part of our continuing mission to provide you with exceptional heart care, we have created designated Provider Care Teams.  These Care Teams include your primary Cardiologist (physician) and Advanced Practice Providers (APPs -  Physician Assistants and Nurse Practitioners) who all work together to provide you with the care you need, when you need it.  We recommend signing up for the patient portal called "MyChart".  Sign up information is provided on this After Visit Summary.  MyChart is used to connect with patients for Virtual Visits (Telemedicine).  Patients are able to view lab/test results, encounter notes, upcoming appointments, etc.  Non-urgent messages can be sent to your provider as well.   To learn more about what you can do with MyChart, go to [email or web address].    Your next appointment:   6 month(s)  The format for your next appointment:   In Person  Provider:   Nona Dell, MD    Other Instructions Thank you for choosing [facility]!    Important Information About Sugar

## 2021-09-10 ENCOUNTER — Telehealth: Payer: Self-pay | Admitting: Cardiology

## 2021-09-10 NOTE — Telephone Encounter (Signed)
Gloria Kief, PA-C  P Cv Div [employer] all stable, no changes, thanks    Attempt to reach, no answer,no machine.

## 2021-09-10 NOTE — Telephone Encounter (Signed)
Pt is requesting call back in regards to her lab results done yesterday. States she is not great at [employer] and could probably use some advice on this as well.

## 2021-09-10 NOTE — Telephone Encounter (Signed)
Lab results discussed with patient.She has no pcp

## 2021-09-10 NOTE — Telephone Encounter (Signed)
Await provider to result labs first.

## 2022-02-10 IMAGING — DX DG CHEST 1V PORT
1 series · 1 of 1 positions shown · non-contrast
Comparison: 06/22/2005

CLINICAL DATA: Chest pain, shortness of breath, cough

EXAM:
PORTABLE CHEST 1 VIEW

[chest ap]
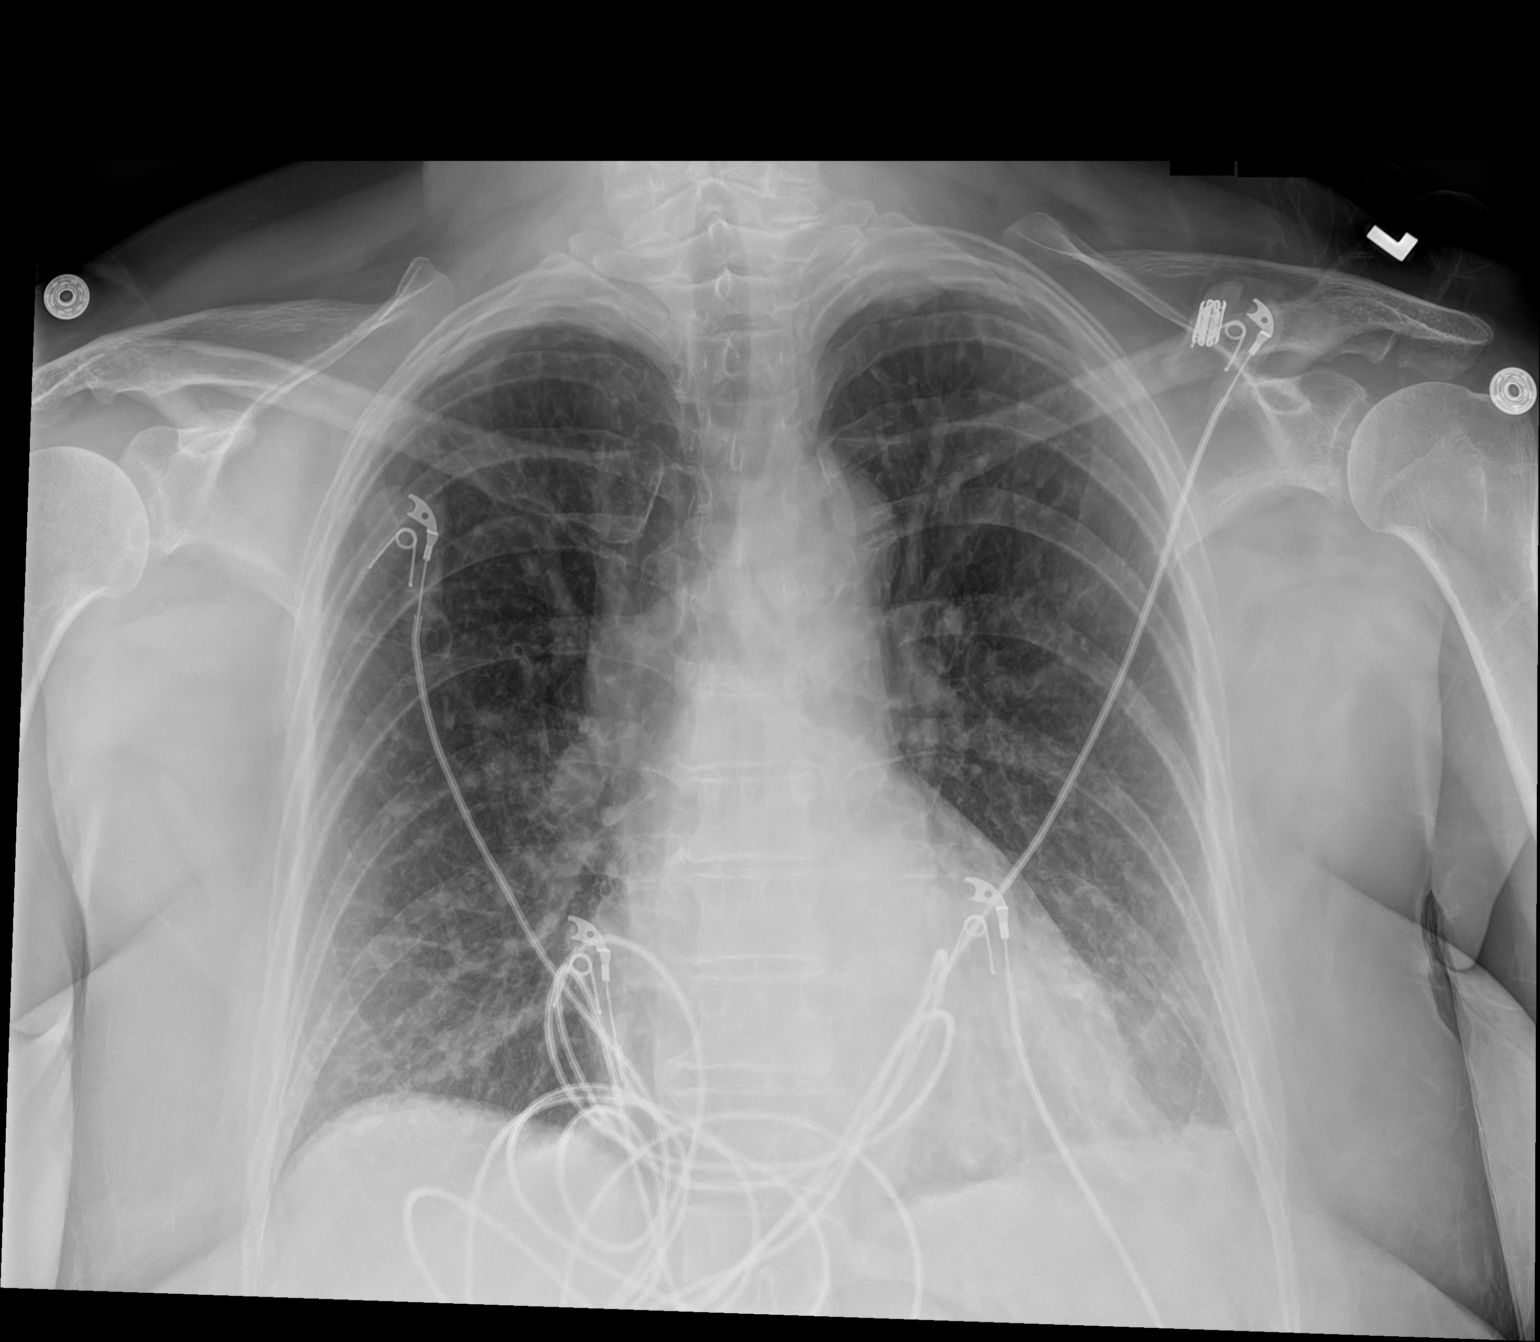

[1 of 1 positions shown; findings below may reference images not displayed]

FINDINGS: Heart is normal size. No confluent opacities, effusions or edema. No
acute bony abnormality.
IMPRESSION: No active disease.

## 2022-03-05 ENCOUNTER — Telehealth: Payer: Self-pay | Admitting: Cardiology

## 2022-03-05 MED ORDER — ATORVASTATIN CALCIUM 80 MG PO TABS: 80.0000 mg | ORAL_TABLET | Freq: Every day | ORAL | 3 refills | Status: DC

## 2022-03-05 NOTE — Telephone Encounter (Signed)
Completed.

## 2022-03-05 NOTE — Telephone Encounter (Signed)
*  STAT* If patient is at the pharmacy, call can be transferred to refill team.   1. Which medications need to be refilled? (please list name of each medication and dose if known)   atorvastatin (LIPITOR) 80 MG tablet   2. Which pharmacy/location (including street and city if local pharmacy) is medication to be sent to?  [id number] - [place], [place] - [address]   3. Do they need a 30 day or 90 day supply? 90 day  Patient stated she has 5 days worth of this medication left.

## 2022-04-07 ENCOUNTER — Other Ambulatory Visit: Payer: Self-pay

## 2022-04-07 ENCOUNTER — Telehealth: Payer: Self-pay | Admitting: Cardiology

## 2022-04-07 ENCOUNTER — Ambulatory Visit: Payer: PPO | Admitting: Cardiology

## 2022-04-07 NOTE — Telephone Encounter (Signed)
Previous note continued - Patient would like to get orders for lab work to be drawn prior to her appointment on 2/7.

## 2022-04-07 NOTE — Telephone Encounter (Signed)
Returned call to pt. No answer. Voicemail has not been set up.

## 2022-04-07 NOTE — Progress Notes (Deleted)
Cardiology Office Note  Date: 04/07/2022   ID: Gloria Thompson, DOB [birth year], MRN [id number]  PCP:  Pcp, No  Cardiologist:  Nona Dell, MD Electrophysiologist:  None   No chief complaint on file.   History of Present Illness: Gloria Thompson is a 66 y.o. female last seen in May by Ms. Geni Bers PA-C.  Chlorthalidone was added at the last visit in May.  Past Medical History:  Diagnosis Date   Acid reflux    CAD (coronary artery disease)    a. LHC 08/10/19: 100% of prox to mid LAD s/p DES, 60% of 1st Mrgb. 09/2020: cath showing patent LAD stent with 50% 1st Mrg stenosis   Essential hypertension    History of cardiomyopathy    Normalization of LVEF 02/2020   Hyperlipidemia     Past Surgical History:  Procedure Laterality Date   APPENDECTOMY     CESAREAN SECTION  [id number]   CORONARY/GRAFT ACUTE MI REVASCULARIZATION N/A 08/10/2019   Procedure: Coronary/Graft Acute MI Revascularization;  Surgeon: Lennette Bihari, MD;  Location: [facility] INVASIVE CV LAB;  Service: Cardiovascular;  Laterality: N/A;   kidney stones     LEFT HEART CATH AND CORONARY ANGIOGRAPHY N/A 08/10/2019   Procedure: LEFT HEART CATH AND CORONARY ANGIOGRAPHY;  Surgeon: Lennette Bihari, MD;  Location: [facility] INVASIVE CV LAB;  Service: Cardiovascular;  Laterality: N/A;   LEFT HEART CATH AND CORONARY ANGIOGRAPHY N/A 09/19/2020   Procedure: LEFT HEART CATH AND CORONARY ANGIOGRAPHY;  Surgeon: Runell Gess, MD;  Location: [facility] INVASIVE CV LAB;  Service: Cardiovascular;  Laterality: N/A;    Current Outpatient Medications  Medication Sig Dispense Refill   aspirin EC 81 MG tablet Take 1 tablet (81 mg total) by mouth daily. 30 tablet 6   atorvastatin (LIPITOR) 80 MG tablet Take 1 tablet (80 mg total) by mouth daily. 90 tablet 3   chlorthalidone (HYGROTON) 25 MG tablet Take 1 tablet (25 mg total) by mouth daily. 90 tablet 3   clopidogrel (PLAVIX) 75 MG tablet Take 1 tablet (75 mg total) by mouth daily. (Patient not  taking: Reported on 09/09/2021) 90 tablet 3   diphenhydrAMINE (BENADRYL) 25 MG tablet Take 1 tablet (25 mg total) by mouth every 6 (six) hours as needed for allergies. 20 tablet 0   famotidine (PEPCID) 20 MG tablet Take 1 tablet (20 mg total) by mouth 2 (two) times daily. 30 tablet 0   metoprolol succinate (TOPROL-XL) 100 MG 24 hr tablet TAKE 1 TABLET BY MOUTH ONCE DAILY WITH OR IMMEDIATELY FOLLOWING A MEAL 90 tablet 3   NIFEdipine (PROCARDIA-XL/NIFEDICAL-XL) 30 MG 24 hr tablet Take 1 tablet (30 mg total) by mouth daily. 90 tablet 3   nitroGLYCERIN (NITROSTAT) 0.4 MG SL tablet Place 1 tablet (0.4 mg total) under the tongue every 5 (five) minutes as needed. (Patient taking differently: Place 0.4 mg under the tongue every 5 (five) minutes x 3 doses as needed for chest pain (if no relief after 2nd dose, proceed to the ED for an evaluation or call [phone]).) 25 tablet 12   pantoprazole (PROTONIX) 40 MG tablet Take 1 tablet (40 mg total) by mouth daily. 30 tablet 1   No current facility-administered medications for this visit.   Allergies:  Amlodipine, Hydralazine, Lisinopril, Losartan, Other, Polytrim [polymyxin b-trimethoprim], Spironolactone, and Codeine   Social History: The patient  reports that she has never smoked. She has never used smokeless tobacco. She reports that she does not drink alcohol and does not use drugs.  Family History: The patient's family history includes COPD in her sister; Diabetes in her sister; Heart attack in her father; Heart disease in her brother, brother, brother, brother, and mother; Hyperlipidemia in her sister and sister; Hypertension in her brother, brother, brother, brother, sister, and sister.   ROS:  Please see the history of present illness. Otherwise, complete review of systems is positive for {NONE DEFAULTED:18576}.  All other systems are reviewed and negative.   Physical Exam: VS:  There were no vitals taken for this visit., BMI There is no height or weight on  file to calculate BMI.  Wt Readings from Last 3 Encounters:  09/09/21 177 lb 9.6 oz (80.6 kg)  12/17/20 187 lb 12.8 oz (85.2 kg)  09/20/20 186 lb 6.4 oz (84.6 kg)    General: Patient appears comfortable at rest. HEENT: Conjunctiva and lids normal, oropharynx clear with moist mucosa. Neck: Supple, no elevated JVP or carotid bruits, no thyromegaly. Lungs: Clear to auscultation, nonlabored breathing at rest. Cardiac: Regular rate and rhythm, no S3 or significant systolic murmur, no pericardial rub. Abdomen: Soft, nontender, no hepatomegaly, bowel sounds present, no guarding or rebound. Extremities: No pitting edema, distal pulses 2+. Skin: Warm and dry. Musculoskeletal: No kyphosis. Neuropsychiatric: Alert and oriented x3, affect grossly appropriate.  ECG:  An ECG dated 09/09/2021 was personally reviewed today and demonstrated:  Sinus rhythm.  Recent Labwork: 09/09/2021: ALT 39; AST 44; BUN 17; Creatinine, Ser 0.96; Hemoglobin 14.3; Platelets 235; Potassium 4.6; Sodium 140; TSH 1.057     Component Value Date/Time   CHOL 116 09/09/2021 1206   TRIG 147 09/09/2021 1206   HDL 38 (L) 09/09/2021 1206   CHOLHDL 3.1 09/09/2021 1206   VLDL 29 09/09/2021 1206   LDLCALC 49 09/09/2021 1206    Other Studies Reviewed Today:  Cardiac catheterization 09/19/2020: 1st Mrg lesion is 50% stenosed. Previously placed Prox LAD to Mid LAD stent (unknown type) is widely patent.  Echocardiogram 09/20/2020:  1. Left ventricular ejection fraction, by estimation, is 60 to 65%. The  left ventricle has normal function. The left ventricle has no regional  wall motion abnormalities. There is mild concentric left ventricular  hypertrophy. Left ventricular diastolic  parameters are consistent with Grade I diastolic dysfunction (impaired  relaxation).   2. Right ventricular systolic function is normal. The right ventricular  size is normal. Tricuspid regurgitation signal is inadequate for assessing  PA pressure.    3. The mitral valve is normal in structure. Trivial mitral valve  regurgitation. No evidence of mitral stenosis.   4. The aortic valve is tricuspid. There is mild calcification of the  aortic valve. There is mild thickening of the aortic valve. Aortic valve  regurgitation is not visualized. Mild aortic valve sclerosis is present,  with no evidence of aortic valve  stenosis.   5. The inferior vena cava is normal in size with greater than 50%  respiratory variability, suggesting right atrial pressure of 3 mmHg.   Assessment and Plan:   Medication Adjustments/Labs and Tests Ordered: Current medicines are reviewed at length with the patient today.  Concerns regarding medicines are outlined above.   Tests Ordered: No orders of the defined types were placed in this encounter.   Medication Changes: No orders of the defined types were placed in this encounter.   Disposition:  Follow up {follow up:15908}  Signed, Jonelle Sidle, MD, [facility] 04/07/2022 8:27 AM    [facility] at [facility] [address], [place], [place] [address]  Phone: [phone]; Fax: [phone]

## 2022-04-07 NOTE — Telephone Encounter (Signed)
Returned call to pt. No answer. No voice mail.  

## 2022-04-07 NOTE — Telephone Encounter (Signed)
Patient would like to get orders for lab work

## 2022-04-10 NOTE — Telephone Encounter (Signed)
Patient would like to have labs drawn prior to her office visit.  Patient is requesting TSH, A1C, LIPID, CBC, BMET, MAG as she has no PCP.   Pt to see Ronie Spies, PA-C on 2/7.  Please advise.

## 2022-04-14 NOTE — Telephone Encounter (Signed)
Spoke to patient who verbalized understanding of plan. Patient had no questions or concerns at this time.

## 2022-05-11 ENCOUNTER — Ambulatory Visit: Payer: PPO | Admitting: Physician Assistant

## 2022-05-27 ENCOUNTER — Ambulatory Visit: Payer: PPO | Admitting: Physician Assistant

## 2022-06-23 NOTE — Progress Notes (Signed)
Cardiology Office Note:    Date:  07/07/2022   ID:  Gloria Thompson, DOB January 06, 1956, MRN [id number]  PCP:  Satira Sark, MD  [facility] Providers Cardiologist:  None     Referring MD: No ref. provider found   Chief Complaint:  No chief complaint on file.     History of Present Illness:   Gloria Thompson is a 67 y.o. female with past medical history of CAD (s/p STEMI in 07/2019 with DES to LAD), HFimpEF (EF 25% by echo in 07/2019, normalized to 55-60% by repeat echo in 02/2020), palpitations (monitor in 03/2020 showing PAC's PVC's and brief SVT), HTN and HLD    Cath 09/2020 for chest pain widely patent LAD stent and 50% OM1. Echo EF 60-65% no WMA.   Last saw Gloria. Strader PA-C 11/2020 and stopped her hydralazine as she thought it was causing her chest pain. BP was running high. Intolerant to lisinopril, losartan, spiro, amlodipine. She started nifedipine 30 mg daily and consider chlorthalidone.   I saw the patient 08/2021 and BP running high. I added chlorthalidone 25 mg daily.      Patient comes in for f/u. BP up and down at home. Trying to watch her salt and diet closely.  She does chair exercises at home for ~ 10 min 5 days/week. She walks 1 hr/day. Denies chest pain, palpitations, dyspnea, edema.      Past Medical History:  Diagnosis Date   Acid reflux    CAD (coronary artery disease)    a. LHC 08/10/19: 100% of prox to mid LAD s/p DES, 60% of 1st Mrgb. 09/2020: cath showing patent LAD stent with 50% 1st Mrg stenosis   Essential hypertension    History of cardiomyopathy    Normalization of LVEF 02/2020   Hyperlipidemia    Current Medications: Current Meds  Medication Sig   albuterol (VENTOLIN HFA) 108 (90 Base) MCG/ACT inhaler Inhale 1-2 puffs into the lungs every 6 (six) hours as needed for wheezing or shortness of breath.   aspirin EC 81 MG tablet Take 1 tablet (81 mg total) by mouth daily.   atorvastatin (LIPITOR) 80 MG tablet Take 1 tablet (80 mg total)  by mouth daily.   chlorthalidone (HYGROTON) 25 MG tablet Take 1 tablet (25 mg total) by mouth 2 (two) times daily.   diphenhydrAMINE (BENADRYL) 25 MG tablet Take 1 tablet (25 mg total) by mouth every 6 (six) hours as needed for allergies.   metoprolol succinate (TOPROL-XL) 100 MG 24 hr tablet TAKE 1 TABLET BY MOUTH ONCE DAILY WITH OR IMMEDIATELY FOLLOWING A MEAL   NIFEdipine (PROCARDIA-XL/NIFEDICAL-XL) 30 MG 24 hr tablet Take 1 tablet (30 mg total) by mouth daily.   nitroGLYCERIN (NITROSTAT) 0.4 MG SL tablet Place 1 tablet (0.4 mg total) under the tongue every 5 (five) minutes as needed. (Patient taking differently: Place 0.4 mg under the tongue every 5 (five) minutes x 3 doses as needed for chest pain (if no relief after 2nd dose, proceed to the ED for an evaluation or call [phone]).)   pantoprazole (PROTONIX) 40 MG tablet Take 1 tablet (40 mg total) by mouth daily.    Allergies:   Amlodipine, Hydralazine, Lisinopril, Losartan, Other, Polymyxin b, Polytrim [polymyxin b-trimethoprim], Spironolactone, and Codeine   Social History   Tobacco Use   Smoking status: Never   Smokeless tobacco: Never  Vaping Use   Vaping Use: Never used  Substance Use Topics   Alcohol use: No   Drug use: No  Family Hx: The patient's family history includes COPD in her sister; Diabetes in her sister; Heart attack in her father; Heart disease in her brother, brother, brother, brother, and mother; Hyperlipidemia in her sister and sister; Hypertension in her brother, brother, brother, brother, sister, and sister.  ROS     Physical Exam:    VS:  BP (!) 140/88   Pulse 74   Ht 5\' 4"  (1.626 m)   Wt 171 lb 3.2 oz (77.7 kg)   SpO2 97%   BMI 29.39 kg/m     Wt Readings from Last 3 Encounters:  07/07/22 171 lb 3.2 oz (77.7 kg)  09/09/21 177 lb 9.6 oz (80.6 kg)  12/17/20 187 lb 12.8 oz (85.2 kg)    Physical Exam  GEN: Well nourished, well developed, in no acute distress  Neck: no JVD, carotid bruits, or  masses Cardiac:RRR; no murmurs, rubs, or gallops  Respiratory:  clear to auscultation bilaterally, normal work of breathing GI: soft, nontender, nondistended, + BS Ext: without cyanosis, clubbing, or edema, Good distal pulses bilaterally Neuro:  Alert and Oriented x 3,  Psych: euthymic mood, full affect        EKGs/Labs/Other Test Reviewed:    EKG:  EKG is not ordered today.     Recent Labs: 09/09/2021: ALT 39; BUN 17; Creatinine, Ser 0.96; Hemoglobin 14.3; Platelets 235; Potassium 4.6; Sodium 140; TSH 1.057   Recent Lipid Panel Recent Labs    09/09/21 1206  CHOL 116  TRIG 147  HDL 38*  VLDL 29  LDLCALC 49     Prior CV Studies:     Echocardiogram: 09/2020 IMPRESSIONS     1. Left ventricular ejection fraction, by estimation, is 60 to 65%. The  left ventricle has normal function. The left ventricle has no regional  wall motion abnormalities. There is mild concentric left ventricular  hypertrophy. Left ventricular diastolic  parameters are consistent with Grade I diastolic dysfunction (impaired  relaxation).   2. Right ventricular systolic function is normal. The right ventricular  size is normal. Tricuspid regurgitation signal is inadequate for assessing  PA pressure.   3. The mitral valve is normal in structure. Trivial mitral valve  regurgitation. No evidence of mitral stenosis.   4. The aortic valve is tricuspid. There is mild calcification of the  aortic valve. There is mild thickening of the aortic valve. Aortic valve  regurgitation is not visualized. Mild aortic valve sclerosis is present,  with no evidence of aortic valve  stenosis.   5. The inferior vena cava is normal in size with greater than 50%  respiratory variability, suggesting right atrial pressure of 3 mmHg.      Cardiac Catheterization: 09/2020 1st Mrg lesion is 50% stenosed. Previously placed Prox LAD to Mid LAD stent (unknown type) is widely patent. IMPRESSION:Gloria Thompson was transferred from  [facility] for chest pain that awakened her from sleep.  She is status post LAD stenting a little over a year ago with an excellent result by Dr. Claiborne Billings.  Her stent is widely patent.  She has no other disease.  There is no "culprit lesion".  She will be treated for noncardiac chest pain.  The sheath was removed and a TR band was placed on the right wrist to achieve patent hemostasis.  The patient left the lab in stable condition.        Risk Assessment/Calculations/Metrics:         HYPERTENSION CONTROL Vitals:   07/07/22 1101 07/07/22 1137  BP: Marland Kitchen)  140/88 (!) 140/88    The patient's blood pressure is elevated above target today.  In order to address the patient's elevated BP: Blood pressure will be monitored at home to determine if medication changes need to be made.; A current anti-hypertensive medication was adjusted today.       ASSESSMENT & PLAN:   No problem-specific Assessment & Plan notes found for this encounter.   CAD s/p STEMI in 07/2019 with DES to LAD and recent cath in 09/2020 showed a patent stent with only 50% 1st Mrg stenosis. No angina, no bleeding problems  on ASA and Plavix-having labs Thurs with new PCP-I asked that labs be sent to [place].   HFimpEF EF was at 25% by echo in 07/2019, normalized to 55-60% by repeat echo in 02/2020. Repeat echocardiogram in 09/2020 showed her EF remained stable at 60 to 65%, no CHF on exam   Palpitations with monitor 03/2020 showed PAC's PVC's and brief SVT. Her symptoms have overall been well controlled on Toprol-XL 100 mg daily.   HTN with multiple medication intolerances as listed above. BP continues to run a bit high-will increase chlorthalidone 25 mg bid.  labs to be checked at PCP on Thurs. She restricts salt intake   HLD on lipitor labs Thurs                   Dispo:  No follow-ups on file.   Medication Adjustments/Labs and Tests Ordered: Current medicines are reviewed at length with the patient today.  Concerns  regarding medicines are outlined above.  Tests Ordered: No orders of the defined types were placed in this encounter.  Medication Changes: Meds ordered this encounter  Medications   chlorthalidone (HYGROTON) 25 MG tablet    Sig: Take 1 tablet (25 mg total) by mouth 2 (two) times daily.    Dispense:  180 tablet    Refill:  3   Signed, Ermalinda Barrios, PA-C  07/07/2022 11:40 AM    [address], [place], [place]  [address] Phone: [phone]; Fax: [phone]

## 2022-06-30 ENCOUNTER — Telehealth: Payer: Self-pay | Admitting: Cardiology

## 2022-06-30 NOTE — Telephone Encounter (Signed)
I spoke with patient, she will get labs done at new pcp apt

## 2022-06-30 NOTE — Telephone Encounter (Signed)
Pt called stating that they were wanting to do their lab with their PCP on 03/21, but wasn't sure if she needs to have her lab work completed on her appt  03/19 with PA Ermalinda Barrios. Please advise

## 2022-07-07 ENCOUNTER — Ambulatory Visit: Payer: 59 | Attending: Cardiology | Admitting: Physician Assistant

## 2022-07-07 ENCOUNTER — Encounter: Payer: Self-pay | Admitting: Physician Assistant

## 2022-07-07 VITALS — BP 140/88 | HR 74 | Ht 64.0 in | Wt 171.2 lb

## 2022-07-07 DIAGNOSIS — R002 Palpitations: Secondary | ICD-10-CM | POA: Diagnosis not present

## 2022-07-07 DIAGNOSIS — I5030 Unspecified diastolic (congestive) heart failure: Secondary | ICD-10-CM

## 2022-07-07 DIAGNOSIS — I2510 Atherosclerotic heart disease of native coronary artery without angina pectoris: Secondary | ICD-10-CM

## 2022-07-07 DIAGNOSIS — E785 Hyperlipidemia, unspecified: Secondary | ICD-10-CM

## 2022-07-07 DIAGNOSIS — I10 Essential (primary) hypertension: Secondary | ICD-10-CM | POA: Diagnosis not present

## 2022-07-07 MED ORDER — CHLORTHALIDONE 25 MG PO TABS: 25.0000 mg | ORAL_TABLET | Freq: Two times a day (BID) | ORAL | 3 refills | Status: DC

## 2022-07-07 NOTE — Patient Instructions (Signed)
Medication Instructions:  Your physician has recommended you make the following change in your medication:   Increase Chlorthalidone to 25 mg Two Times Daily   *If you need a refill on your cardiac medications before your next appointment, please call your pharmacy*   Lab Work: NONE   If you have labs (blood work) drawn today and your tests are completely normal, you will receive your results only by: [place] [place] (if you have MyChart) OR A paper copy in the mail If you have any lab test that is abnormal or we need to change your treatment, we will call you to review the results.   Testing/Procedures: NONE    Follow-Up: At [facility], you and your health needs are our priority.  As part of our continuing mission to provide you with exceptional heart care, we have created designated Provider Care Teams.  These Care Teams include your primary Cardiologist (physician) and Advanced Practice Providers (APPs -  Physician Assistants and Nurse Practitioners) who all work together to provide you with the care you need, when you need it.  We recommend signing up for the patient portal called "MyChart".  Sign up information is provided on this After Visit Summary.  MyChart is used to connect with patients for Virtual Visits (Telemedicine).  Patients are able to view lab/test results, encounter notes, upcoming appointments, etc.  Non-urgent messages can be sent to your provider as well.   To learn more about what you can do with MyChart, go to [email or web address].    Your next appointment:   6 month(s)  Provider:   Rozann Lesches, MD    Other Instructions Thank you for choosing [facility]!

## 2022-07-09 ENCOUNTER — Ambulatory Visit (INDEPENDENT_AMBULATORY_CARE_PROVIDER_SITE_OTHER): Payer: 59 | Admitting: Family Medicine

## 2022-07-09 ENCOUNTER — Encounter: Payer: Self-pay | Admitting: Family Medicine

## 2022-07-09 VITALS — BP 155/79 | HR 79 | Temp 98.3°F | Ht 64.0 in | Wt 171.0 lb

## 2022-07-09 DIAGNOSIS — R899 Unspecified abnormal finding in specimens from other organs, systems and tissues: Secondary | ICD-10-CM

## 2022-07-09 DIAGNOSIS — F411 Generalized anxiety disorder: Secondary | ICD-10-CM | POA: Diagnosis not present

## 2022-07-09 DIAGNOSIS — R002 Palpitations: Secondary | ICD-10-CM | POA: Diagnosis not present

## 2022-07-09 DIAGNOSIS — E785 Hyperlipidemia, unspecified: Secondary | ICD-10-CM | POA: Diagnosis not present

## 2022-07-09 DIAGNOSIS — I10 Essential (primary) hypertension: Secondary | ICD-10-CM | POA: Diagnosis not present

## 2022-07-09 DIAGNOSIS — Z91148 Patient's other noncompliance with medication regimen for other reason: Secondary | ICD-10-CM

## 2022-07-09 MED ORDER — SERTRALINE HCL 25 MG PO TABS: 25.0000 mg | ORAL_TABLET | Freq: Every day | ORAL | 0 refills | Status: DC

## 2022-07-09 NOTE — Progress Notes (Signed)
Established Patient Office Visit  Subjective   Patient ID: Gloria Thompson, female    DOB: 1955/09/22  Age: 67 y.o. 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   Concerned today for her BP Is followed by Cardiology. Recently seen 07/07/22 Is only taking metoprolol and atorvastatin States that she rarely takes her nitro, "maybe once every 6 months."  Stopped chlorthalidone due to side effect.  Is concerned that the medication about the medication pill getting bigger, but denies trouble swallowing medication Endorses symptoms of chest fluttering and chest pain.  When she takes metop she cuts it in half and takes one in the morning and one in the afternoon. States that she took the full dose of metop for 1-2 days and then she felt that it was beating weird.  Woke up this morning anxious and with chest pain.  Denies swelling of extremities   GAD  States that her anxiety has been worse in recent years. She is not able to sleep at night due to her anxiety. She excessively worries and struggles to distract herself. Is frequently stressed about her medications.   ROS As per HPI    Objective:  BP (!) 155/79   Pulse 79   Temp 98.3 F (36.8 C)   Ht 5\' 4"  (1.626 m)   Wt 171 lb (77.6 kg)   SpO2 95%   BMI 29.35 kg/m   Physical Exam Constitutional:      General: She is not in acute distress.    Appearance: Normal appearance. She is not ill-appearing, toxic-appearing or diaphoretic.  Cardiovascular:     Rate and Rhythm: Normal rate.     Pulses: Normal pulses.     Heart sounds: Normal heart sounds. No murmur heard.    No gallop.  Pulmonary:     Effort: Pulmonary effort is normal. No respiratory distress.     Breath sounds: Normal breath sounds. No stridor. No wheezing,  rhonchi or rales.  Skin:    General: Skin is warm.     Capillary Refill: Capillary refill takes less than 2 seconds.  Neurological:     General: No focal deficit present.     Mental Status: She is alert and oriented to person, place, and time. Mental status is at baseline.     Motor: No weakness.  Psychiatric:        Mood and Affect: Mood normal.        Behavior: Behavior normal.        Thought Content: Thought content normal.        Judgment: Judgment normal.    Assessment & Plan:  1. Generalized anxiety disorder Pt screened positive for anxiety  today with GAD screening. Pt offered nonpharmacologic and pharmacologic therapy. Pt declined initiating nonpharm treatment at this time. Safety contract established today with patient in clinic. Denies intent to harm herself or others. Will start medication as below. Educated pt on side effects.  - sertraline (ZOLOFT) 25 MG tablet; Take 1 tablet (25 mg total) by mouth daily.  Dispense: 30 tablet; Refill: 0  2. Palpitations Labs as below. Will communicate results to patient once available. Pt declined zio at this time. Pt previously had zio in 2021 with SVT and PVCs, is prescribed Metoprolol for control  of rhythm. See below for follow up of Cardiology.  - CBC with Differential/Platelet; Future - CMP14+EGFR; Future - TSH; Future - VITAMIN D 25 Hydroxy (Vit-D Deficiency, Fractures); Future  3. Primary hypertension Not well controlled at this time. Pt follows with cardiology and has medication for pick up from them. Elevated BP today in office. Discussed with patient monitoring BP at home. Provided BP log to patient in clinic today. Instructed pt to take BP first thing in the morning after sitting for 5 minutes with feet flat on the floor, arm at heart level. Discussed with patient options for BP cuff at [facility], [facility], [place]. Pt verbalized they were able to obtain BP cuff. Will review measurements with patient at follow up and determine  plan for BP management.   4. Hyperlipidemia, unspecified hyperlipidemia type Labs as below. Will communicate results to patient once available.  Not fasting. Pt will return to clinic when she is fasting for labs.  - Lipid panel; Future  5. Noncompliance w/medication treatment due to intermit use of medication Pt has been noncompliant with medications as they are prescribed. Per chart review was seen 07/07/2022 by Cardiology. Pt has been prescribed Metoprolol XL 100 mg for SVT/PVCs. Pt has been cutting metoprolol in half. Taking half in the am and half in the pm. She states that she does this because she believes the medication causes chest pain and fluttering. In addition, Cardiology note states that she is well controlled on aspirin and plavix. Per patient, she is not taking plavix (is taking asa). Furthermore, pt has not been taking chlorthalidone as she believes that it is contributing to her chest pain and fluttering. Discussed at length with patient to take her medications (metoprolol and chlorthalidone) as prescribed for at least 2 weeks and then follow up with cardiology if it is causing her symptoms. Provided pt with a BP log in office today to follow her BP at home.   The above assessment and management plan was discussed with the patient. The patient verbalized understanding of and has agreed to the management plan using shared-decision making. Patient is aware to call the clinic if they develop any new symptoms or if symptoms fail to improve or worsen. Patient is aware when to return to the clinic for a follow-up visit. Patient educated on when it is appropriate to go to the emergency department.   4 weeks CC follow up    Donzetta Kohut, DNP-FNP [facility] [address] [place], [place] [address] [phone]

## 2022-07-15 ENCOUNTER — Other Ambulatory Visit: Payer: 59

## 2022-07-15 ENCOUNTER — Other Ambulatory Visit: Payer: Self-pay

## 2022-07-15 DIAGNOSIS — R899 Unspecified abnormal finding in specimens from other organs, systems and tissues: Secondary | ICD-10-CM

## 2022-07-15 DIAGNOSIS — R002 Palpitations: Secondary | ICD-10-CM

## 2022-07-15 DIAGNOSIS — E785 Hyperlipidemia, unspecified: Secondary | ICD-10-CM

## 2022-07-15 LAB — LIPID PANEL

## 2022-07-16 ENCOUNTER — Other Ambulatory Visit: Payer: 59

## 2022-07-16 DIAGNOSIS — R899 Unspecified abnormal finding in specimens from other organs, systems and tissues: Secondary | ICD-10-CM

## 2022-07-16 LAB — CMP14+EGFR
ALT: 30 IU/L (ref 0–32)
AST: 33 IU/L (ref 0–40)
Albumin/Globulin Ratio: 1.5 (ref 1.2–2.2)
Albumin: 4.4 g/dL (ref 3.9–4.9)
Alkaline Phosphatase: 123 IU/L — ABNORMAL HIGH (ref 44–121)
BUN/Creatinine Ratio: 15 (ref 12–28)
BUN: 22 mg/dL (ref 8–27)
Bilirubin Total: 0.7 mg/dL (ref 0.0–1.2)
CO2: 25 mmol/L (ref 20–29)
Calcium: 9.5 mg/dL (ref 8.7–10.3)
Chloride: 99 mmol/L (ref 96–106)
Creatinine, Ser: 1.48 mg/dL — ABNORMAL HIGH (ref 0.57–1.00)
Globulin, Total: 3 g/dL (ref 1.5–4.5)
Glucose: 103 mg/dL — ABNORMAL HIGH (ref 70–99)
Potassium: 4.5 mmol/L (ref 3.5–5.2)
Sodium: 139 mmol/L (ref 134–144)
Total Protein: 7.4 g/dL (ref 6.0–8.5)
eGFR: 39 mL/min/{1.73_m2} — ABNORMAL LOW (ref >59)

## 2022-07-16 LAB — LIPID PANEL
Chol/HDL Ratio: 3 ratio (ref 0.0–4.4)
Cholesterol, Total: 123 mg/dL (ref 100–199)
HDL: 41 mg/dL (ref >39)
LDL Chol Calc (NIH): 66 mg/dL (ref 0–99)
Triglycerides: 83 mg/dL (ref 0–149)
VLDL Cholesterol Cal: 16 mg/dL (ref 5–40)

## 2022-07-16 LAB — CBC WITH DIFFERENTIAL/PLATELET
Basophils Absolute: 0.1 10*3/uL (ref 0.0–0.2)
Basos: 1 %
EOS (ABSOLUTE): 0.4 10*3/uL (ref 0.0–0.4)
Eos: 6 %
Hematocrit: 45.4 % (ref 34.0–46.6)
Hemoglobin: 15.2 g/dL (ref 11.1–15.9)
Immature Grans (Abs): 0 10*3/uL (ref 0.0–0.1)
Immature Granulocytes: 0 %
Lymphocytes Absolute: 2.3 10*3/uL (ref 0.7–3.1)
Lymphs: 37 %
MCH: 32.2 pg (ref 26.6–33.0)
MCHC: 33.5 g/dL (ref 31.5–35.7)
MCV: 96 fL (ref 79–97)
Monocytes Absolute: 0.5 10*3/uL (ref 0.1–0.9)
Monocytes: 9 %
Neutrophils Absolute: 3 10*3/uL (ref 1.4–7.0)
Neutrophils: 47 %
Platelets: 285 10*3/uL (ref 150–450)
RBC: 4.72 x10E6/uL (ref 3.77–5.28)
RDW: 11.8 % (ref 11.7–15.4)
WBC: 6.2 10*3/uL (ref 3.4–10.8)

## 2022-07-16 LAB — TSH: TSH: 1.31 u[IU]/mL (ref 0.450–4.500)

## 2022-07-16 LAB — VITAMIN D 25 HYDROXY (VIT D DEFICIENCY, FRACTURES): Vit D, 25-Hydroxy: 33.2 ng/mL (ref 30.0–100.0)

## 2022-07-16 NOTE — Addendum Note (Signed)
Addended by: Donzetta Kohut on: 07/16/2022 09:03 AM   Modules accepted: Orders

## 2022-07-17 ENCOUNTER — Observation Stay (HOSPITAL_COMMUNITY)
Admission: EM | Admit: 2022-07-17 | Discharge: 2022-07-19 | Disposition: A | Discharge status: Home / Self Care | Payer: 59 | Attending: Family Medicine | Admitting: Family Medicine

## 2022-07-17 ENCOUNTER — Encounter (HOSPITAL_COMMUNITY): Payer: Self-pay

## 2022-07-17 ENCOUNTER — Other Ambulatory Visit: Payer: Self-pay

## 2022-07-17 ENCOUNTER — Emergency Department (HOSPITAL_COMMUNITY): Payer: 59

## 2022-07-17 DIAGNOSIS — I10 Essential (primary) hypertension: Secondary | ICD-10-CM | POA: Insufficient documentation

## 2022-07-17 DIAGNOSIS — E782 Mixed hyperlipidemia: Secondary | ICD-10-CM | POA: Diagnosis present

## 2022-07-17 DIAGNOSIS — E860 Dehydration: Secondary | ICD-10-CM | POA: Diagnosis not present

## 2022-07-17 DIAGNOSIS — Z7982 Long term (current) use of aspirin: Secondary | ICD-10-CM | POA: Diagnosis not present

## 2022-07-17 DIAGNOSIS — I2510 Atherosclerotic heart disease of native coronary artery without angina pectoris: Secondary | ICD-10-CM | POA: Insufficient documentation

## 2022-07-17 DIAGNOSIS — N179 Acute kidney failure, unspecified: Secondary | ICD-10-CM | POA: Diagnosis not present

## 2022-07-17 DIAGNOSIS — K219 Gastro-esophageal reflux disease without esophagitis: Secondary | ICD-10-CM | POA: Diagnosis present

## 2022-07-17 DIAGNOSIS — R7989 Other specified abnormal findings of blood chemistry: Secondary | ICD-10-CM | POA: Diagnosis present

## 2022-07-17 DIAGNOSIS — Z79899 Other long term (current) drug therapy: Secondary | ICD-10-CM | POA: Diagnosis not present

## 2022-07-17 DIAGNOSIS — E876 Hypokalemia: Secondary | ICD-10-CM

## 2022-07-17 DIAGNOSIS — E871 Hypo-osmolality and hyponatremia: Secondary | ICD-10-CM

## 2022-07-17 DIAGNOSIS — F32A Depression, unspecified: Secondary | ICD-10-CM | POA: Insufficient documentation

## 2022-07-17 LAB — URINALYSIS, ROUTINE W REFLEX MICROSCOPIC
Bacteria, UA: NONE SEEN
Bilirubin Urine: NEGATIVE
Glucose, UA: NEGATIVE mg/dL
Hgb urine dipstick: NEGATIVE
Ketones, ur: NEGATIVE mg/dL
Nitrite: NEGATIVE
Protein, ur: NEGATIVE mg/dL
Specific Gravity, Urine: 1.009 (ref 1.005–1.030)
pH: 5 (ref 5.0–8.0)

## 2022-07-17 LAB — BMP8+EGFR
BUN/Creatinine Ratio: 20 (ref 12–28)
BUN: 28 mg/dL — ABNORMAL HIGH (ref 8–27)
CO2: 24 mmol/L (ref 20–29)
Calcium: 9.3 mg/dL (ref 8.7–10.3)
Chloride: 97 mmol/L (ref 96–106)
Creatinine, Ser: 1.42 mg/dL — ABNORMAL HIGH (ref 0.57–1.00)
Glucose: 81 mg/dL (ref 70–99)
Potassium: 4 mmol/L (ref 3.5–5.2)
Sodium: 136 mmol/L (ref 134–144)
eGFR: 41 mL/min/{1.73_m2} — ABNORMAL LOW (ref >59)

## 2022-07-17 LAB — CBC WITH DIFFERENTIAL/PLATELET
Abs Immature Granulocytes: 0.01 10*3/uL (ref 0.00–0.07)
Basophils Absolute: 0 10*3/uL (ref 0.0–0.1)
Basophils Relative: 1 %
Eosinophils Absolute: 0.2 10*3/uL (ref 0.0–0.5)
Eosinophils Relative: 4 %
HCT: 41.1 % (ref 36.0–46.0)
Hemoglobin: 14.3 g/dL (ref 12.0–15.0)
Immature Granulocytes: 0 %
Lymphocytes Relative: 32 %
Lymphs Abs: 1.9 10*3/uL (ref 0.7–4.0)
MCH: 32.3 pg (ref 26.0–34.0)
MCHC: 34.8 g/dL (ref 30.0–36.0)
MCV: 92.8 fL (ref 80.0–100.0)
Monocytes Absolute: 0.6 10*3/uL (ref 0.1–1.0)
Monocytes Relative: 10 %
Neutro Abs: 3.1 10*3/uL (ref 1.7–7.7)
Neutrophils Relative %: 53 %
Platelets: 247 10*3/uL (ref 150–400)
RBC: 4.43 MIL/uL (ref 3.87–5.11)
RDW: 11.6 % (ref 11.5–15.5)
WBC: 5.8 10*3/uL (ref 4.0–10.5)
nRBC: 0 % (ref 0.0–0.2)

## 2022-07-17 LAB — BASIC METABOLIC PANEL
Anion gap: 10 (ref 5–15)
BUN: 33 mg/dL — ABNORMAL HIGH (ref 8–23)
CO2: 22 mmol/L (ref 22–32)
Calcium: 9 mg/dL (ref 8.9–10.3)
Chloride: 101 mmol/L (ref 98–111)
Creatinine, Ser: 1.77 mg/dL — ABNORMAL HIGH (ref 0.44–1.00)
GFR, Estimated: 31 mL/min — ABNORMAL LOW (ref >60)
Glucose, Bld: 128 mg/dL — ABNORMAL HIGH (ref 70–99)
Potassium: 3.4 mmol/L — ABNORMAL LOW (ref 3.5–5.1)
Sodium: 133 mmol/L — ABNORMAL LOW (ref 135–145)

## 2022-07-17 MED ORDER — ONDANSETRON HCL 4 MG/2ML IJ SOLN: 4.0000 mg | Freq: Four times a day (QID) | INTRAMUSCULAR | Status: DC | PRN

## 2022-07-17 MED ORDER — SODIUM CHLORIDE 0.9 % IV BOLUS
1000.0000 mL | Freq: Once | INTRAVENOUS | Status: AC
  Administered 2022-07-17: 1000 mL via INTRAVENOUS

## 2022-07-17 MED ORDER — SODIUM CHLORIDE 0.9 % IV SOLN: INTRAVENOUS | Status: DC

## 2022-07-17 MED ORDER — SERTRALINE HCL 50 MG PO TABS
25.0000 mg | ORAL_TABLET | Freq: Every day | ORAL | Status: DC
  Administered 2022-07-17 – 2022-07-19 (×3): 25 mg via ORAL
  Filled 2022-07-17 (×3): qty 1

## 2022-07-17 MED ORDER — HEPARIN SODIUM (PORCINE) 5000 UNIT/ML IJ SOLN
5000.0000 [IU] | Freq: Three times a day (TID) | INTRAMUSCULAR | Status: DC
  Administered 2022-07-17 – 2022-07-19 (×5): 5000 [IU] via SUBCUTANEOUS
  Filled 2022-07-17 (×5): qty 1

## 2022-07-17 MED ORDER — ASPIRIN 81 MG PO TBEC
81.0000 mg | DELAYED_RELEASE_TABLET | Freq: Every day | ORAL | Status: DC
  Administered 2022-07-18 – 2022-07-19 (×2): 81 mg via ORAL
  Filled 2022-07-17 (×3): qty 1

## 2022-07-17 MED ORDER — POTASSIUM CHLORIDE CRYS ER 20 MEQ PO TBCR
40.0000 meq | EXTENDED_RELEASE_TABLET | Freq: Once | ORAL | Status: AC
  Administered 2022-07-17: 40 meq via ORAL
  Filled 2022-07-17: qty 2

## 2022-07-17 MED ORDER — PANTOPRAZOLE SODIUM 40 MG PO TBEC
40.0000 mg | DELAYED_RELEASE_TABLET | Freq: Every day | ORAL | Status: DC
  Administered 2022-07-17: 40 mg via ORAL
  Filled 2022-07-17 (×3): qty 1

## 2022-07-17 MED ORDER — ONDANSETRON HCL 4 MG PO TABS: 4.0000 mg | ORAL_TABLET | Freq: Four times a day (QID) | ORAL | Status: DC | PRN

## 2022-07-17 MED ORDER — ACETAMINOPHEN 325 MG PO TABS: 650.0000 mg | ORAL_TABLET | Freq: Four times a day (QID) | ORAL | Status: DC | PRN

## 2022-07-17 MED ORDER — ATORVASTATIN CALCIUM 40 MG PO TABS
80.0000 mg | ORAL_TABLET | Freq: Every day | ORAL | Status: DC
  Administered 2022-07-17 – 2022-07-18 (×2): 80 mg via ORAL
  Filled 2022-07-17 (×2): qty 2

## 2022-07-17 MED ORDER — ACETAMINOPHEN 650 MG RE SUPP: 650.0000 mg | Freq: Four times a day (QID) | RECTAL | Status: DC | PRN

## 2022-07-17 MED ORDER — METOPROLOL SUCCINATE ER 50 MG PO TB24
100.0000 mg | ORAL_TABLET | Freq: Every day | ORAL | Status: DC
  Administered 2022-07-18 – 2022-07-19 (×2): 100 mg via ORAL
  Filled 2022-07-17 (×2): qty 2

## 2022-07-17 NOTE — H&P (Addendum)
History and Physical    Patient: Gloria Thompson [id number] DOB: [birth year] DOA: 07/17/2022 DOS: the patient was seen and examined on 07/17/2022 PCP: Pryor Ochoa, FNP  Patient coming from: Home  Chief Complaint:  Chief Complaint  Patient presents with   Abnormal Lab   HPI: Gloria Thompson is a 67 y.o. female with medical history significant of essential hypertension, hyperlipidemia, CAD (s/p STEMI with DES to LAD-07/2019), palpitations (monitor in 03/2020 showing PAC's PVC's and brief SVT, history of angioedema (felt to be caused by lisinopril) who presents to the emergency department due to being asked by her PCP to go to the ED due to abnormal labs.  She was recently started on a blood pressure medication about a week ago (chlorthalidone), since starting this medication, she has been having increased urinary frequency and lightheadedness when she stands up, though she denies blood in urine, fever, chills, nausea or vomiting.  ED Course:  In the emergency department, she was hemodynamically stable.  Workup in the ED showed normal CBC, BMP showed sodium 133, potassium 3.4 chloride 101, bicarb 22, blood glucose 128, BUN/creatinine 33/1.77 (baseline creatinine was 0.9-1.0), EGFR 31, baseline EGFR was > 60).  Urinalysis was normal. Renal/urinary tract ultrasound showed no hydronephrosis.  6.4 cm simple cyst in the lower pole of the left kidney requires no follow-up imaging. IV hydration of 1 L NS was provided in the ED.  Hospitalist was asked to admit patient for further evaluation and management.   Review of Systems: Review of systems as noted in the HPI. All other systems reviewed and are negative.   Past Medical History:  Diagnosis Date   Acid reflux    CAD (coronary artery disease)    a. LHC 08/10/19: 100% of prox to mid LAD s/p DES, 60% of 1st Mrgb. 09/2020: cath showing patent LAD stent with 50% 1st Mrg stenosis   Essential hypertension    History of cardiomyopathy     Normalization of LVEF 02/2020   Hyperlipidemia    Past Surgical History:  Procedure Laterality Date   APPENDECTOMY     CESAREAN SECTION  [id number]   CORONARY/GRAFT ACUTE MI REVASCULARIZATION N/A 08/10/2019   Procedure: Coronary/Graft Acute MI Revascularization;  Surgeon: Troy Sine, MD;  Location: [facility];  Service: Cardiovascular;  Laterality: N/A;   kidney stones     LEFT HEART CATH AND CORONARY ANGIOGRAPHY N/A 08/10/2019   Procedure: LEFT HEART CATH AND CORONARY ANGIOGRAPHY;  Surgeon: Troy Sine, MD;  Location: [facility];  Service: Cardiovascular;  Laterality: N/A;   LEFT HEART CATH AND CORONARY ANGIOGRAPHY N/A 09/19/2020   Procedure: LEFT HEART CATH AND CORONARY ANGIOGRAPHY;  Surgeon: Lorretta Harp, MD;  Location: [facility];  Service: Cardiovascular;  Laterality: N/A;    Social History:  reports that she has never smoked. She has never used smokeless tobacco. She reports that she does not drink alcohol and does not use drugs.   Allergies  Allergen Reactions   Amlodipine     Chest Tightness   Hydralazine     Chest Tightness   Lisinopril Swelling   Losartan     Nausea, Headache   Other     States allergies to multiple antibiotics but NAMES UNKNOWN.   Polymyxin B     Burning eyes, shortness of breath, chest pain   Polytrim [Polymyxin B-Trimethoprim]     Burning eyes, shortness of breath, chest pain   Spironolactone     Dizziness, Nausea  Codeine Palpitations    Family History  Problem Relation Age of Onset   Heart disease Mother    Heart attack Father    Diabetes Sister    Heart disease Brother    Hypertension Brother    Hypertension Sister    Hyperlipidemia Sister    COPD Sister    Hypertension Sister    Hyperlipidemia Sister    Heart disease Brother    Hypertension Brother    Hypertension Brother    Heart disease Brother    Heart disease Brother    Hypertension Brother       Prior to Admission medications    Medication Sig Start Date End Date Taking? Authorizing Provider  albuterol (VENTOLIN HFA) 108 (90 Base) MCG/ACT inhaler Inhale 1-2 puffs into the lungs every 6 (six) hours as needed for wheezing or shortness of breath. 01/16/22 01/16/23  [provider]  aspirin EC 81 MG tablet Take 1 tablet (81 mg total) by mouth daily. 08/11/19   Bhagat, Crista Luria, PA  atorvastatin (LIPITOR) 80 MG tablet Take 1 tablet (80 mg total) by mouth daily. 03/05/22   Satira Sark, MD  diphenhydrAMINE (BENADRYL) 25 MG tablet Take 1 tablet (25 mg total) by mouth every 6 (six) hours as needed for allergies. 05/31/20   Fredia Sorrow, MD  esomeprazole (NEXIUM) 20 MG capsule Take 20 mg by mouth daily at 12 noon.    [provider]  metoprolol succinate (TOPROL-XL) 100 MG 24 hr tablet TAKE 1 TABLET BY MOUTH ONCE DAILY WITH OR IMMEDIATELY FOLLOWING A MEAL 09/09/21   Imogene Burn, PA-C  nitroGLYCERIN (NITROSTAT) 0.4 MG SL tablet Place 1 tablet (0.4 mg total) under the tongue every 5 (five) minutes as needed. Patient taking differently: Place 0.4 mg under the tongue every 5 (five) minutes x 3 doses as needed for chest pain (if no relief after 2nd dose, proceed to the ED for an evaluation or call [phone]). 08/11/19   Bhagat, Bhavinkumar, PA  sertraline (ZOLOFT) 25 MG tablet Take 1 tablet (25 mg total) by mouth daily. 07/09/22   Pryor Ochoa, FNP    Physical Exam: BP (!) 148/85 (BP Location: Right Arm)   Pulse 67   Temp 97.7 F (36.5 C) (Oral)   Resp 17   Ht 5\' 4"  (1.626 m)   Wt 76.3 kg   SpO2 100%   BMI 28.87 kg/m   General: 67 y.o. year-old female well developed well nourished in no acute distress.  Alert and oriented x3. HEENT: NCAT, EOMI, dry mucous membrane. Neck: Supple, trachea medial Cardiovascular: Regular rate and rhythm with no rubs or gallops.  No thyromegaly or JVD noted.  No lower extremity edema. 2/4 pulses in all 4 extremities. Respiratory: Clear to auscultation with no  wheezes or rales. Good inspiratory effort. Abdomen: Soft, nontender nondistended with normal bowel sounds x4 quadrants. Muskuloskeletal: No cyanosis, clubbing or edema noted bilaterally Neuro: CN II-XII intact, strength 5/5 x 4, sensation, reflexes intact Skin: No ulcerative lesions noted or rashes Psychiatry: Judgement and insight appear normal. Mood is appropriate for condition and setting          Labs on Admission:  Basic Metabolic Panel: Recent Labs  Lab 07/15/22 0846 07/16/22 1411 07/17/22 1445  NA 139 136 133*  K 4.5 4.0 3.4*  CL 99 97 101  CO2 25 24 22   GLUCOSE 103* 81 128*  BUN 22 28* 33*  CREATININE 1.48* 1.42* 1.77*  CALCIUM 9.5 9.3 9.0   Liver Function Tests:  Recent Labs  Lab 07/15/22 0846  AST 33  ALT 30  ALKPHOS 123*  BILITOT 0.7  PROT 7.4  ALBUMIN 4.4   No results for input(s): "LIPASE", "AMYLASE" in the last 168 hours. No results for input(s): "AMMONIA" in the last 168 hours. CBC: Recent Labs  Lab 07/15/22 0846 07/17/22 1445  WBC 6.2 5.8  NEUTROABS 3.0 3.1  HGB 15.2 14.3  HCT 45.4 41.1  MCV 96 92.8  PLT 285 247   Cardiac Enzymes: No results for input(s): "CKTOTAL", "CKMB", "CKMBINDEX", "TROPONINI" in the last 168 hours.  BNP (last 3 results) No results for input(s): "BNP" in the last 8760 hours.  ProBNP (last 3 results) No results for input(s): "PROBNP" in the last 8760 hours.  CBG: No results for input(s): "GLUCAP" in the last 168 hours.  Radiological Exams on Admission: US Renal  Result Date: 07/17/2022 CLINICAL DATA:  Abnormal labs flank pain EXAM: RENAL / URINARY TRACT ULTRASOUND COMPLETE COMPARISON:  None Available. FINDINGS: Right Kidney: Renal measurements: 10.7 x 4.3 x 6.1 cm = volume: 145.0 mL. No hydronephrosis. Normal renal cortical echogenicity. Left Kidney: Renal measurements: 12.8 x 6.2 x 6.1 cm = volume: 253.7 mL. There is a 6.4 cm simple cyst in the lower pole the left kidney. This requires no follow-up imaging. No  hydronephrosis. Normal renal cortical echogenicity. Bladder: Appears normal for degree of bladder distention. Other: None. IMPRESSION: No hydronephrosis. 6.4 cm simple cyst in the lower pole the left kidney requires no follow-up imaging. Electronically Signed   By: Maurine Simmering M.D.   On: 07/17/2022 15:54    EKG: I independently viewed the EKG done and my findings are as followed: EKG was not done in the ED  Assessment/Plan Present on Admission:  Acute kidney injury ([place])  Essential hypertension  Mixed hyperlipidemia  GERD (gastroesophageal reflux disease)  Principal Problem:   Acute kidney injury ([place]) Active Problems:   Essential hypertension   Mixed hyperlipidemia   GERD (gastroesophageal reflux disease)   Dehydration   Depression  Acute kidney injury possibly due to new onset diuretic use Dehydration BUN/creatinine 33/1.77 (baseline creatinine was 0.9-1.0), EGFR 31, baseline EGFR was > 60).  Continue gentle hydration Renally adjust medications, avoid nephrotoxic agents/dehydration/hypotension  Hyponatremia possibly due to dehydration/medication (?  Diuretic) Na 133, continue IV hydration  Hypokalemia K+ 3.4, this will be replenished  Essential hypertension Continue Toprol-XL  Mixed hyperlipidemia Continue Lipitor  CAD Continue aspirin, Lipitor, Toprol-XL  GERD Continue Protonix  Depression Continue Zoloft   DVT prophylaxis: Heparin subcu  Code Status: Full code  Consults: None  Family Communication: None at bedside  Severity of Illness: The appropriate patient status for this patient is OBSERVATION. Observation status is judged to be reasonable and necessary in order to provide the required intensity of service to ensure the patient's safety. The patient's presenting symptoms, physical exam findings, and initial radiographic and laboratory data in the context of their medical condition is felt to place them at decreased risk for further clinical  deterioration. Furthermore, it is anticipated that the patient will be medically stable for discharge from the hospital within 2 midnights of admission.   Author: Bernadette Hoit, DO 07/17/2022 6:19 PM  For on call review [email or web address].

## 2022-07-17 NOTE — ED Provider Notes (Signed)
[facility] Provider Note   CSN: [id number] Arrival date & time: 07/17/22  1411     History  Chief Complaint  Patient presents with   Abnormal Lab    Gloria Thompson is a 67 y.o. female.   Abnormal Lab    Patient with medical history of hypertension, hyperlipidemia, CAD presents to the emergency department due to abnormal lab value.  Patient was started on a new blood pressure medicine a week ago, she is unsure what the name was.  She was sent to the ED because her creatinine had doubled since starting the medication.  She did take the blood pressure medicine this morning.  She has been having increased urinary frequency since starting the medicine as well as some dysuria and some slight left-sided flank pain.  Denies any hematuria, no nausea or vomiting.  No fevers at home.  Home Medications Prior to Admission medications   Medication Sig Start Date End Date Taking? Authorizing Provider  albuterol (VENTOLIN HFA) 108 (90 Base) MCG/ACT inhaler Inhale 1-2 puffs into the lungs every 6 (six) hours as needed for wheezing or shortness of breath. 01/16/22 01/16/23  [provider]  aspirin EC 81 MG tablet Take 1 tablet (81 mg total) by mouth daily. 08/11/19   Bhagat, Crista Luria, PA  atorvastatin (LIPITOR) 80 MG tablet Take 1 tablet (80 mg total) by mouth daily. 03/05/22   Satira Sark, MD  diphenhydrAMINE (BENADRYL) 25 MG tablet Take 1 tablet (25 mg total) by mouth every 6 (six) hours as needed for allergies. 05/31/20   Fredia Sorrow, MD  esomeprazole (NEXIUM) 20 MG capsule Take 20 mg by mouth daily at 12 noon.    [provider]  metoprolol succinate (TOPROL-XL) 100 MG 24 hr tablet TAKE 1 TABLET BY MOUTH ONCE DAILY WITH OR IMMEDIATELY FOLLOWING A MEAL 09/09/21   Imogene Burn, PA-C  nitroGLYCERIN (NITROSTAT) 0.4 MG SL tablet Place 1 tablet (0.4 mg total) under the tongue every 5 (five) minutes as needed. Patient  taking differently: Place 0.4 mg under the tongue every 5 (five) minutes x 3 doses as needed for chest pain (if no relief after 2nd dose, proceed to the ED for an evaluation or call [phone]). 08/11/19   Bhagat, Bhavinkumar, PA  sertraline (ZOLOFT) 25 MG tablet Take 1 tablet (25 mg total) by mouth daily. 07/09/22   Milian, Gilmore Laroche, FNP      Allergies    Amlodipine, Hydralazine, Lisinopril, Losartan, Other, Polymyxin b, Polytrim [polymyxin b-trimethoprim], Spironolactone, and Codeine    Review of Systems   Review of Systems  Physical Exam Updated Vital Signs BP 134/70   Pulse 80   Temp 98.3 F (36.8 C) (Oral)   Resp 20   Ht 5\' 4"  (1.626 m)   Wt 77.6 kg   SpO2 97%   BMI 29.35 kg/m  Physical Exam Vitals and nursing note reviewed. Exam conducted with a chaperone present.  Constitutional:      Appearance: Normal appearance.  HENT:     Head: Normocephalic and atraumatic.  Eyes:     General: No scleral icterus.       Right eye: No discharge.        Left eye: No discharge.     Extraocular Movements: Extraocular movements intact.     Pupils: Pupils are equal, round, and reactive to light.  Cardiovascular:     Rate and Rhythm: Normal rate and regular rhythm.     Pulses: Normal  pulses.     Heart sounds: Normal heart sounds.     No friction rub. No gallop.  Pulmonary:     Effort: Pulmonary effort is normal. No respiratory distress.     Breath sounds: Normal breath sounds.  Abdominal:     General: Abdomen is flat. Bowel sounds are normal. There is no distension.     Palpations: Abdomen is soft.     Tenderness: There is abdominal tenderness. There is no left CVA tenderness.     Comments: Suprapubic tenderness  Skin:    General: Skin is warm and dry.     Coloration: Skin is not jaundiced.  Neurological:     Mental Status: She is alert. Mental status is at baseline.     Coordination: Coordination normal.     ED Results / Procedures / Treatments   Labs (all labs ordered are  listed, but only abnormal results are displayed) Labs Reviewed  BASIC METABOLIC PANEL - Abnormal; Notable for the following components:      Result Value   Sodium 133 (*)    Potassium 3.4 (*)    Glucose, Bld 128 (*)    BUN 33 (*)    Creatinine, Ser 1.77 (*)    GFR, Estimated 31 (*)    All other components within normal limits  URINALYSIS, ROUTINE W REFLEX MICROSCOPIC - Abnormal; Notable for the following components:   Color, Urine STRAW (*)    Leukocytes,Ua TRACE (*)    All other components within normal limits  CBC WITH DIFFERENTIAL/PLATELET    EKG None  Radiology US Renal  Result Date: 07/17/2022 CLINICAL DATA:  Abnormal labs flank pain EXAM: RENAL / URINARY TRACT ULTRASOUND COMPLETE COMPARISON:  None Available. FINDINGS: Right Kidney: Renal measurements: 10.7 x 4.3 x 6.1 cm = volume: 145.0 mL. No hydronephrosis. Normal renal cortical echogenicity. Left Kidney: Renal measurements: 12.8 x 6.2 x 6.1 cm = volume: 253.7 mL. There is a 6.4 cm simple cyst in the lower pole the left kidney. This requires no follow-up imaging. No hydronephrosis. Normal renal cortical echogenicity. Bladder: Appears normal for degree of bladder distention. Other: None. IMPRESSION: No hydronephrosis. 6.4 cm simple cyst in the lower pole the left kidney requires no follow-up imaging. Electronically Signed   By: Maurine Simmering M.D.   On: 07/17/2022 15:54    Procedures Procedures    Medications Ordered in ED Medications  sodium chloride 0.9 % bolus 1,000 mL (1,000 mLs Intravenous Bolus 07/17/22 1509)    ED Course/ Medical Decision Making/ A&P                             Medical Decision Making Amount and/or Complexity of Data Reviewed Labs: ordered. Radiology: ordered.  Risk Decision regarding hospitalization.   Patient presents to the emergency department due to elevated creatinine after starting the blood pressure medicine.  Suspect this is medication induced AKI, will check labs, she also having  some mild significant tenderness and increased urinary frequency which could be a side effect of medication but will check UA as well as ultrasound renal study.    I reviewed external medical records, the blood pressure change was increasing the chlorthalidone to 25 mg twice daily.  She is afebrile, no SIRS criteria and lower suspicion for pyelonephritis, sepsis or nephrolithiasis at this time. No hydronephrosis on ultrasound, UA with trace amounts of leukocytes but no obvious UTI.  Creatinine is not markedly worse at 1.77.  BUN  Date Value Ref Range Status  07/17/2022 33 (H) 8 - 23 mg/dL Final  07/16/2022 28 (H) 8 - 27 mg/dL Final  07/15/2022 22 8 - 27 mg/dL Final  09/09/2021 17 8 - 23 mg/dL Final  09/20/2020 10 8 - 23 mg/dL Final  09/19/2020 17 8 - 23 mg/dL Final  08/24/2019 11 8 - 27 mg/dL Final   Creatinine, Ser  Date Value Ref Range Status  07/17/2022 1.77 (H) 0.44 - 1.00 mg/dL Final  07/16/2022 1.42 (H) 0.57 - 1.00 mg/dL Final  07/15/2022 1.48 (H) 0.57 - 1.00 mg/dL Final  09/09/2021 0.96 0.44 - 1.00 mg/dL Final   I will consult hospitalist for admission for AKI.  No indication of UTI, [place], nephrolithiasis.  Patient is not septic.  I spoke with the hospitalist who agrees with admission.        Final Clinical Impression(s) / ED Diagnoses Final diagnoses:  AKI (acute kidney injury) [facility])    Rx / [place] Orders ED Discharge Orders     None         Sherrill Raring, [place] 07/17/22 1627    Milton Ferguson, MD 07/18/22 1042

## 2022-07-17 NOTE — ED Triage Notes (Signed)
PCP called her this am and told her to come to ED due to kidney function.  States abnormal lab results  told she needed IVF's

## 2022-07-18 DIAGNOSIS — N179 Acute kidney failure, unspecified: Secondary | ICD-10-CM | POA: Diagnosis not present

## 2022-07-18 LAB — GLUCOSE, CAPILLARY
Glucose-Capillary: 124 mg/dL — ABNORMAL HIGH (ref 70–99)
Glucose-Capillary: 94 mg/dL (ref 70–99)
Glucose-Capillary: 91 mg/dL (ref 70–99)
Glucose-Capillary: 97 mg/dL (ref 70–99)

## 2022-07-18 LAB — CBC
HCT: 38.9 % (ref 36.0–46.0)
Hemoglobin: 13.4 g/dL (ref 12.0–15.0)
MCH: 32.7 pg (ref 26.0–34.0)
MCHC: 34.4 g/dL (ref 30.0–36.0)
MCV: 94.9 fL (ref 80.0–100.0)
Platelets: 224 10*3/uL (ref 150–400)
RBC: 4.1 MIL/uL (ref 3.87–5.11)
RDW: 11.7 % (ref 11.5–15.5)
WBC: 5.9 10*3/uL (ref 4.0–10.5)
nRBC: 0 % (ref 0.0–0.2)

## 2022-07-18 LAB — COMPREHENSIVE METABOLIC PANEL
ALT: 30 U/L (ref 0–44)
AST: 32 U/L (ref 15–41)
Albumin: 3.3 g/dL — ABNORMAL LOW (ref 3.5–5.0)
Alkaline Phosphatase: 94 U/L (ref 38–126)
Anion gap: 6 (ref 5–15)
BUN: 26 mg/dL — ABNORMAL HIGH (ref 8–23)
CO2: 24 mmol/L (ref 22–32)
Calcium: 8.7 mg/dL — ABNORMAL LOW (ref 8.9–10.3)
Chloride: 107 mmol/L (ref 98–111)
Creatinine, Ser: 1.18 mg/dL — ABNORMAL HIGH (ref 0.44–1.00)
GFR, Estimated: 51 mL/min — ABNORMAL LOW (ref >60)
Glucose, Bld: 103 mg/dL — ABNORMAL HIGH (ref 70–99)
Potassium: 3.9 mmol/L (ref 3.5–5.1)
Sodium: 137 mmol/L (ref 135–145)
Total Bilirubin: 0.7 mg/dL (ref 0.3–1.2)
Total Protein: 6.4 g/dL — ABNORMAL LOW (ref 6.5–8.1)

## 2022-07-18 LAB — MAGNESIUM: Magnesium: 1.8 mg/dL (ref 1.7–2.4)

## 2022-07-18 LAB — HIV ANTIBODY (ROUTINE TESTING W REFLEX): HIV Screen 4th Generation wRfx: NONREACTIVE

## 2022-07-18 LAB — PHOSPHORUS: Phosphorus: 3.1 mg/dL (ref 2.5–4.6)

## 2022-07-18 MED ORDER — ATORVASTATIN CALCIUM 80 MG PO TABS: 40.0000 mg | ORAL_TABLET | Freq: Every day | ORAL | 3 refills | Status: DC

## 2022-07-18 MED ORDER — SODIUM CHLORIDE 0.9 % IV SOLN: INTRAVENOUS | Status: AC

## 2022-07-18 NOTE — Progress Notes (Signed)
BP elevated this a.m., other vitals stable. No c/o pain noted.

## 2022-07-18 NOTE — Care Management Obs Status (Signed)
[facility] NOTIFICATION   Patient Details  Name: Gloria Thompson MRN: [id number] Date of Birth: [birth year]   Medicare Observation Status Notification Given:  Yes    Iona Beard, [place] 07/18/2022, 9:26 AM

## 2022-07-18 NOTE — Hospital Course (Signed)
Gloria Thompson is a 67 y.o. female with medical history significant of essential hypertension, hyperlipidemia, CAD (s/p STEMI with DES to LAD-07/2019), palpitations (monitor in 03/2020 showing PAC's PVC's and brief SVT, history of angioedema (felt to be caused by lisinopril) who presents to the emergency department due to being asked by her PCP to go to the ED due to abnormal labs.  She was recently started on a blood pressure medication about a week ago (chlorthalidone), since starting this medication, she has been having increased urinary frequency and lightheadedness when she stands up, though she denies blood in urine, fever, chills, nausea or vomiting.   ED Course:  In the emergency department, she was hemodynamically stable.  Workup in the ED showed normal CBC, BMP showed sodium 133, potassium 3.4 chloride 101, bicarb 22, blood glucose 128, BUN/creatinine 33/1.77 (baseline creatinine was 0.9-1.0), EGFR 31, baseline EGFR was > 60).  Urinalysis was normal. Renal/urinary tract ultrasound showed no hydronephrosis.  6.4 cm simple cyst in the lower pole of the left kidney requires no follow-up imaging. IV hydration of 1 L NS was provided in the ED.  Hospitalist was asked to admit patient for further evaluation and management.   Assessment/Plan Present on Admission:  Acute kidney injury ([place])  Essential hypertension  Mixed hyperlipidemia  GERD (gastroesophageal reflux disease)  Acute kidney injury possibly due to new onset diuretic use Dehydration BUN/creatinine 33/1.77 (baseline creatinine was 0.9-1.0), EGFR 31, baseline EGFR was > 60).  Continue gentle hydration  Lab Results  Component Value Date   CREATININE 1.18 (H) 07/18/2022   CREATININE 1.77 (H) 07/17/2022   CREATININE 1.42 (H) 07/16/2022      Renally adjust medications, avoid nephrotoxic agents/dehydration/hypotension   Hyponatremia possibly due to dehydration/medication (?  Diuretic) Na 133 >>137  -continue IV hydration    Hypokalemia K+ 3.4 >>>3.9  was replenished   Essential hypertension Continue Toprol-XL   Mixed hyperlipidemia Continue Lipitor   CAD Continue aspirin, Lipitor, Toprol-XL   GERD Continue Protonix   Depression Continue Zoloft

## 2022-07-18 NOTE — Progress Notes (Signed)
  Transition of Care [facility]) Screening Note   Patient Details  Name: Gloria Thompson Date of Birth: Sep 30, 1955   [facility] CM/SW Contact:    Iona Beard, [place] Phone Number: 07/18/2022, 9:26 AM    Transition of Care Department [facility]) has reviewed patient and no TOC needs have been identified at this time. We will continue to monitor patient advancement through interdisciplinary progression rounds. If new patient transition needs arise, please place a TOC consult.

## 2022-07-18 NOTE — Discharge Summary (Signed)
Physician Discharge Summary   Patient: Gloria Thompson MRN: [id number] DOB: 1955-11-05  Admit date:     07/17/2022  Discharge date: 07/18/22  Discharge Physician: Deatra James   PCP: Pryor Ochoa, FNP   Recommendations at discharge:  - -Continue oral hydration with Pedialyte, Gatorade -Follow with the PCP,(CBGs has been within normal limits with spikes, ordered A1c) patient is to be evaluated for diabetes mellitus type 2 -From a statin has been reduced from 80 to 40 mg daily, - Rrecommending rechecking LFTs in 6 to 8 weeks -BMP in 1 week results to PCP  Discharge Diagnoses: Principal Problem:   Acute kidney injury ([place]) Active Problems:   Essential hypertension   Mixed hyperlipidemia   GERD (gastroesophageal reflux disease)   Dehydration   Depression  Resolved Problems:   * No resolved hospital problems. *  Hospital Course:  Gloria Thompson is a 67 y.o. female with medical history significant of essential hypertension, hyperlipidemia, CAD (s/p STEMI with DES to LAD-07/2019), palpitations (monitor in 03/2020 showing PAC's PVC's and brief SVT, history of angioedema (felt to be caused by lisinopril) who presents to the emergency department due to being asked by her PCP to go to the ED due to abnormal labs.  She was recently started on a blood pressure medication about a week ago (chlorthalidone), since starting this medication, she has been having increased urinary frequency and lightheadedness when she stands up, though she denies blood in urine, fever, chills, nausea or vomiting.   ED Course:  In the emergency department, she was hemodynamically stable.  Workup in the ED showed normal CBC, BMP showed sodium 133, potassium 3.4 chloride 101, bicarb 22, blood glucose 128, BUN/creatinine 33/1.77 (baseline creatinine was 0.9-1.0), EGFR 31, baseline EGFR was > 60).  Urinalysis was normal. Renal/urinary tract ultrasound showed no hydronephrosis.  6.4 cm simple cyst in the  lower pole of the left kidney requires no follow-up imaging. IV hydration of 1 L NS was provided in the ED.  Hospitalist was asked to admit patient for further evaluation and management.   Assessment/Plan Present on Admission:  Acute kidney injury ([place])  Essential hypertension  Mixed hyperlipidemia  GERD (gastroesophageal reflux disease)  Acute kidney injury possibly due to new onset diuretic use Dehydration BUN/creatinine 33/1.77 (baseline creatinine was 0.9-1.0), EGFR 31, baseline EGFR was > 60).  Continue gentle hydration  Lab Results  Component Value Date   CREATININE 1.18 (H) 07/18/2022   CREATININE 1.77 (H) 07/17/2022   CREATININE 1.42 (H) 07/16/2022   Polydipsia ruling out DM2 A1C: CBG (last 3)  Recent Labs    07/18/22 0832  GLUCAP 124*     Hyponatremia possibly due to dehydration/medication (?  Diuretic) Na 133 >>137  -continue IV hydration   Hypokalemia K+ 3.4 >>>3.9  was replenished   Essential hypertension Continue Toprol-XL   Mixed hyperlipidemia Continue Lipitor   CAD Continue aspirin, Lipitor, Toprol-XL   GERD Continue Protonix   Depression Continue Zoloft    Disposition: Home Diet recommendation:  Discharge Diet Orders (From admission, onward)     Start     Ordered   07/18/22 0000  Diet - low sodium heart healthy        07/18/22 0917           Regular diet DISCHARGE MEDICATION: Allergies as of 07/18/2022       Reactions   Amlodipine    Chest Tightness   Hydralazine    Chest Tightness   Lisinopril Swelling  Losartan    Nausea, Headache   Other    States allergies to multiple antibiotics but NAMES UNKNOWN.   Polymyxin B    Burning eyes, shortness of breath, chest pain   Polytrim [polymyxin B-trimethoprim]    Burning eyes, shortness of breath, chest pain   Spironolactone    Dizziness, Nausea   Codeine Palpitations        Medication List     STOP taking these medications    diphenhydrAMINE 25 MG  tablet Commonly known as: BENADRYL       TAKE these medications    albuterol 108 (90 Base) MCG/ACT inhaler Commonly known as: VENTOLIN HFA Inhale 1-2 puffs into the lungs every 6 (six) hours as needed for wheezing or shortness of breath.   aspirin EC 81 MG tablet Take 1 tablet (81 mg total) by mouth daily.   atorvastatin 80 MG tablet Commonly known as: LIPITOR Take 0.5 tablets (40 mg total) by mouth daily. What changed: how much to take   esomeprazole 20 MG capsule Commonly known as: NEXIUM Take 20 mg by mouth daily at 12 noon.   metoprolol succinate 100 MG 24 hr tablet Commonly known as: TOPROL-XL TAKE 1 TABLET BY MOUTH ONCE DAILY WITH OR IMMEDIATELY FOLLOWING A MEAL   nitroGLYCERIN 0.4 MG SL tablet Commonly known as: Nitrostat Place 1 tablet (0.4 mg total) under the tongue every 5 (five) minutes as needed. What changed:  when to take this reasons to take this   sertraline 25 MG tablet Commonly known as: ZOLOFT Take 1 tablet (25 mg total) by mouth daily.        Discharge Exam: Filed Weights   07/17/22 1426 07/17/22 1712  Weight: 77.6 kg 76.3 kg        General:  AAO x 3,  cooperative, no distress;   HEENT:  Normocephalic, PERRL, otherwise with in Normal limits   Neuro:  CNII-XII intact. , normal motor and sensation, reflexes intact   Lungs:   Clear to auscultation BL, Respirations unlabored,  No wheezes / crackles  Cardio:    S1/S2, RRR, No murmure, No Rubs or Gallops   Abdomen:  Soft, non-tender, bowel sounds active all four quadrants, no guarding or peritoneal signs.  Muscular  skeletal:  Limited exam -global generalized weaknesses - in bed, able to move all 4 extremities,   2+ pulses,  symmetric, No pitting edema  Skin:  Dry, warm to touch, negative for any Rashes,  Wounds: Please see nursing documentation          Condition at discharge: good  The results of significant diagnostics from this hospitalization (including imaging,  microbiology, ancillary and laboratory) are listed below for reference.   Imaging Studies: US Renal  Result Date: 07/17/2022 CLINICAL DATA:  Abnormal labs flank pain EXAM: RENAL / URINARY TRACT ULTRASOUND COMPLETE COMPARISON:  None Available. FINDINGS: Right Kidney: Renal measurements: 10.7 x 4.3 x 6.1 cm = volume: 145.0 mL. No hydronephrosis. Normal renal cortical echogenicity. Left Kidney: Renal measurements: 12.8 x 6.2 x 6.1 cm = volume: 253.7 mL. There is a 6.4 cm simple cyst in the lower pole the left kidney. This requires no follow-up imaging. No hydronephrosis. Normal renal cortical echogenicity. Bladder: Appears normal for degree of bladder distention. Other: None. IMPRESSION: No hydronephrosis. 6.4 cm simple cyst in the lower pole the left kidney requires no follow-up imaging. Electronically Signed   By: Maurine Simmering M.D.   On: 07/17/2022 15:54    Microbiology: Results for orders placed or  performed during the hospital encounter of 09/19/20  Resp Panel by RT-PCR (Flu A&B, Covid) Nasopharyngeal Swab     Status: None   Collection Time: 09/19/20  9:31 AM   Specimen: Nasopharyngeal Swab; Nasopharyngeal(NP) swabs in vial transport medium  Result Value Ref Range Status   SARS Coronavirus 2 by RT PCR NEGATIVE NEGATIVE Final    Comment: (NOTE) SARS-CoV-2 target nucleic acids are NOT DETECTED.  The SARS-CoV-2 RNA is generally detectable in upper respiratory specimens during the acute phase of infection. The lowest concentration of SARS-CoV-2 viral copies this assay can detect is 138 copies/mL. A negative result does not preclude SARS-Cov-2 infection and should not be used as the sole basis for treatment or other patient management decisions. A negative result may occur with  improper specimen collection/handling, submission of specimen other than nasopharyngeal swab, presence of viral mutation(s) within the areas targeted by this assay, and inadequate number of viral copies(<138 copies/mL).  A negative result must be combined with clinical observations, patient history, and epidemiological information. The expected result is Negative.  Fact Sheet for Patients:  EntrepreneurPulse.com.au  Fact Sheet for Healthcare Providers:  IncredibleEmployment.be  This test is no t yet approved or cleared by the [place] FDA and  has been authorized for detection and/or diagnosis of SARS-CoV-2 by FDA under an Emergency Use Authorization (EUA). This EUA will remain  in effect (meaning this test can be used) for the duration of the COVID-19 declaration under Section 564(b)(1) of the Act, 21 U.S.C.section 360bbb-3(b)(1), unless the authorization is terminated  or revoked sooner.       Influenza A by PCR NEGATIVE NEGATIVE Final   Influenza B by PCR NEGATIVE NEGATIVE Final    Comment: (NOTE) The Xpert Xpress SARS-CoV-2/FLU/RSV plus assay is intended as an aid in the diagnosis of influenza from Nasopharyngeal swab specimens and should not be used as a sole basis for treatment. Nasal washings and aspirates are unacceptable for Xpert Xpress SARS-CoV-2/FLU/RSV testing.  Fact Sheet for Patients: EntrepreneurPulse.com.au  Fact Sheet for Healthcare Providers: IncredibleEmployment.be  This test is not yet approved or cleared by the [place] FDA and has been authorized for detection and/or diagnosis of SARS-CoV-2 by FDA under an Emergency Use Authorization (EUA). This EUA will remain in effect (meaning this test can be used) for the duration of the COVID-19 declaration under Section 564(b)(1) of the Act, 21 U.S.C. section 360bbb-3(b)(1), unless the authorization is terminated or revoked.  Performed at [facility], [address], [place], [place] [address]     Labs: CBC: Recent Labs  Lab 07/15/22 0846 07/17/22 1445 07/18/22 0500  WBC 6.2 5.8 5.9  NEUTROABS 3.0 3.1  --   HGB 15.2 14.3 13.4  HCT 45.4  41.1 38.9  MCV 96 92.8 94.9  PLT 285 247 [id number]   Basic Metabolic Panel: Recent Labs  Lab 07/15/22 0846 07/16/22 1411 07/17/22 1445 07/18/22 0500  NA 139 136 133* 137  K 4.5 4.0 3.4* 3.9  CL 99 97 101 107  CO2 25 24 22 24   GLUCOSE 103* 81 128* 103*  BUN 22 28* 33* 26*  CREATININE 1.48* 1.42* 1.77* 1.18*  CALCIUM 9.5 9.3 9.0 8.7*  MG  --   --   --  1.8  PHOS  --   --   --  3.1   Liver Function Tests: Recent Labs  Lab 07/15/22 0846 07/18/22 0500  AST 33 32  ALT 30 30  ALKPHOS 123* 94  BILITOT 0.7 0.7  PROT 7.4  6.4*  ALBUMIN 4.4 3.3*   CBG: Recent Labs  Lab 07/18/22 0832  GLUCAP 124*    Discharge time spent: greater than 30 minutes.  Signed: Deatra James, MD [facility] 07/18/2022

## 2022-07-19 DIAGNOSIS — N179 Acute kidney failure, unspecified: Secondary | ICD-10-CM | POA: Diagnosis not present

## 2022-07-19 LAB — CBC
HCT: 40.1 % (ref 36.0–46.0)
Hemoglobin: 13.9 g/dL (ref 12.0–15.0)
MCH: 32.3 pg (ref 26.0–34.0)
MCHC: 34.7 g/dL (ref 30.0–36.0)
MCV: 93 fL (ref 80.0–100.0)
Platelets: 226 10*3/uL (ref 150–400)
RBC: 4.31 MIL/uL (ref 3.87–5.11)
RDW: 11.5 % (ref 11.5–15.5)
WBC: 6.2 10*3/uL (ref 4.0–10.5)
nRBC: 0 % (ref 0.0–0.2)

## 2022-07-19 LAB — GLUCOSE, CAPILLARY: Glucose-Capillary: 98 mg/dL (ref 70–99)

## 2022-07-19 NOTE — Progress Notes (Signed)
Physician Discharge Summary   Patient: Gloria Thompson MRN: [id number] DOB: 03-15-1956  Admit date:     07/17/2022  Discharge date: 07/19/22  Discharge Physician: Deatra James   PCP: Pryor Ochoa, FNP   Stable to D/C home today   Recommendations at discharge:  - -Continue oral hydration with Pedialyte, Gatorade -Follow with the PCP,(CBGs has been within normal limits with spikes, ordered A1c) patient is to be evaluated for diabetes mellitus type 2 -From a statin has been reduced from 80 to 40 mg daily, - Rrecommending rechecking LFTs in 6 to 8 weeks -BMP in 1 week results to PCP  Discharge Diagnoses: Principal Problem:   Acute kidney injury ([place]) Active Problems:   Essential hypertension   Mixed hyperlipidemia   GERD (gastroesophageal reflux disease)   Dehydration   Depression  Resolved Problems:   * No resolved hospital problems. *  Hospital Course:  Gloria Thompson is a 67 y.o. female with medical history significant of essential hypertension, hyperlipidemia, CAD (s/p STEMI with DES to LAD-07/2019), palpitations (monitor in 03/2020 showing PAC's PVC's and brief SVT, history of angioedema (felt to be caused by lisinopril) who presents to the emergency department due to being asked by her PCP to go to the ED due to abnormal labs.  She was recently started on a blood pressure medication about a week ago (chlorthalidone), since starting this medication, she has been having increased urinary frequency and lightheadedness when she stands up, though she denies blood in urine, fever, chills, nausea or vomiting.   ED Course:  In the emergency department, she was hemodynamically stable.  Workup in the ED showed normal CBC, BMP showed sodium 133, potassium 3.4 chloride 101, bicarb 22, blood glucose 128, BUN/creatinine 33/1.77 (baseline creatinine was 0.9-1.0), EGFR 31, baseline EGFR was > 60).  Urinalysis was normal. Renal/urinary tract ultrasound showed no  hydronephrosis.  6.4 cm simple cyst in the lower pole of the left kidney requires no follow-up imaging. IV hydration of 1 L NS was provided in the ED.  Hospitalist was asked to admit patient for further evaluation and management.   Assessment/Plan Present on Admission:  Acute kidney injury ([place])  Essential hypertension  Mixed hyperlipidemia  GERD (gastroesophageal reflux disease)  Acute kidney injury possibly due to new onset diuretic use Dehydration BUN/creatinine 33/1.77 (baseline creatinine was 0.9-1.0), EGFR 31, baseline EGFR was > 60).  Continue gentle hydration  Lab Results  Component Value Date   CREATININE 1.18 (H) 07/18/2022   CREATININE 1.77 (H) 07/17/2022   CREATININE 1.42 (H) 07/16/2022   Polydipsia ruling out DM2 A1C: CBG (last 3)  Recent Labs    07/18/22 1137 07/18/22 1621 07/18/22 2129  GLUCAP 94 91 97     Hyponatremia possibly due to dehydration/medication (?  Diuretic) Na 133 >>137  -continue IV hydration   Hypokalemia K+ 3.4 >>>3.9  was replenished   Essential hypertension Continue Toprol-XL   Mixed hyperlipidemia Continue Lipitor   CAD Continue aspirin, Lipitor, Toprol-XL   GERD Continue Protonix   Depression Continue Zoloft    Disposition: Home Diet recommendation:  Discharge Diet Orders (From admission, onward)     Start     Ordered   07/18/22 0000  Diet - low sodium heart healthy        07/18/22 0917           Regular diet DISCHARGE MEDICATION: Allergies as of 07/19/2022       Reactions   Amlodipine    Chest Tightness  Hydralazine    Chest Tightness   Lisinopril Swelling   Losartan    Nausea, Headache   Other    States allergies to multiple antibiotics but NAMES UNKNOWN.   Polymyxin B    Burning eyes, shortness of breath, chest pain   Polytrim [polymyxin B-trimethoprim]    Burning eyes, shortness of breath, chest pain   Spironolactone    Dizziness, Nausea   Codeine Palpitations        Medication List      STOP taking these medications    diphenhydrAMINE 25 MG tablet Commonly known as: BENADRYL       TAKE these medications    albuterol 108 (90 Base) MCG/ACT inhaler Commonly known as: VENTOLIN HFA Inhale 1-2 puffs into the lungs every 6 (six) hours as needed for wheezing or shortness of breath.   aspirin EC 81 MG tablet Take 1 tablet (81 mg total) by mouth daily.   atorvastatin 80 MG tablet Commonly known as: LIPITOR Take 0.5 tablets (40 mg total) by mouth daily. What changed: how much to take   esomeprazole 20 MG capsule Commonly known as: NEXIUM Take 20 mg by mouth daily at 12 noon.   metoprolol succinate 100 MG 24 hr tablet Commonly known as: TOPROL-XL TAKE 1 TABLET BY MOUTH ONCE DAILY WITH OR IMMEDIATELY FOLLOWING A MEAL   nitroGLYCERIN 0.4 MG SL tablet Commonly known as: Nitrostat Place 1 tablet (0.4 mg total) under the tongue every 5 (five) minutes as needed. What changed:  when to take this reasons to take this   sertraline 25 MG tablet Commonly known as: ZOLOFT Take 1 tablet (25 mg total) by mouth daily.        Discharge Exam: Filed Weights   07/17/22 1426 07/17/22 1712  Weight: 77.6 kg 76.3 kg        General:  AAO x 3,  cooperative, no distress;   HEENT:  Normocephalic, PERRL, otherwise with in Normal limits   Neuro:  CNII-XII intact. , normal motor and sensation, reflexes intact   Lungs:   Clear to auscultation BL, Respirations unlabored,  No wheezes / crackles  Cardio:    S1/S2, RRR, No murmure, No Rubs or Gallops   Abdomen:  Soft, non-tender, bowel sounds active all four quadrants, no guarding or peritoneal signs.  Muscular  skeletal:  Limited exam -global generalized weaknesses - in bed, able to move all 4 extremities,   2+ pulses,  symmetric, No pitting edema  Skin:  Dry, warm to touch, negative for any Rashes,  Wounds: Please see nursing documentation          Condition at discharge: good  The results of significant  diagnostics from this hospitalization (including imaging, microbiology, ancillary and laboratory) are listed below for reference.   Imaging Studies: US Renal  Result Date: 07/17/2022 CLINICAL DATA:  Abnormal labs flank pain EXAM: RENAL / URINARY TRACT ULTRASOUND COMPLETE COMPARISON:  None Available. FINDINGS: Right Kidney: Renal measurements: 10.7 x 4.3 x 6.1 cm = volume: 145.0 mL. No hydronephrosis. Normal renal cortical echogenicity. Left Kidney: Renal measurements: 12.8 x 6.2 x 6.1 cm = volume: 253.7 mL. There is a 6.4 cm simple cyst in the lower pole the left kidney. This requires no follow-up imaging. No hydronephrosis. Normal renal cortical echogenicity. Bladder: Appears normal for degree of bladder distention. Other: None. IMPRESSION: No hydronephrosis. 6.4 cm simple cyst in the lower pole the left kidney requires no follow-up imaging. Electronically Signed   By: Ileene Patrick.D.  On: 07/17/2022 15:54    Microbiology: Results for orders placed or performed during the hospital encounter of 09/19/20  Resp Panel by RT-PCR (Flu A&B, Covid) Nasopharyngeal Swab     Status: None   Collection Time: 09/19/20  9:31 AM   Specimen: Nasopharyngeal Swab; Nasopharyngeal(NP) swabs in vial transport medium  Result Value Ref Range Status   SARS Coronavirus 2 by RT PCR NEGATIVE NEGATIVE Final    Comment: (NOTE) SARS-CoV-2 target nucleic acids are NOT DETECTED.  The SARS-CoV-2 RNA is generally detectable in upper respiratory specimens during the acute phase of infection. The lowest concentration of SARS-CoV-2 viral copies this assay can detect is 138 copies/mL. A negative result does not preclude SARS-Cov-2 infection and should not be used as the sole basis for treatment or other patient management decisions. A negative result may occur with  improper specimen collection/handling, submission of specimen other than nasopharyngeal swab, presence of viral mutation(s) within the areas targeted by this  assay, and inadequate number of viral copies(<138 copies/mL). A negative result must be combined with clinical observations, patient history, and epidemiological information. The expected result is Negative.  Fact Sheet for Patients:  EntrepreneurPulse.com.au  Fact Sheet for Healthcare Providers:  IncredibleEmployment.be  This test is no t yet approved or cleared by the [place] FDA and  has been authorized for detection and/or diagnosis of SARS-CoV-2 by FDA under an Emergency Use Authorization (EUA). This EUA will remain  in effect (meaning this test can be used) for the duration of the COVID-19 declaration under Section 564(b)(1) of the Act, 21 U.S.C.section 360bbb-3(b)(1), unless the authorization is terminated  or revoked sooner.       Influenza A by PCR NEGATIVE NEGATIVE Final   Influenza B by PCR NEGATIVE NEGATIVE Final    Comment: (NOTE) The Xpert Xpress SARS-CoV-2/FLU/RSV plus assay is intended as an aid in the diagnosis of influenza from Nasopharyngeal swab specimens and should not be used as a sole basis for treatment. Nasal washings and aspirates are unacceptable for Xpert Xpress SARS-CoV-2/FLU/RSV testing.  Fact Sheet for Patients: EntrepreneurPulse.com.au  Fact Sheet for Healthcare Providers: IncredibleEmployment.be  This test is not yet approved or cleared by the [place] FDA and has been authorized for detection and/or diagnosis of SARS-CoV-2 by FDA under an Emergency Use Authorization (EUA). This EUA will remain in effect (meaning this test can be used) for the duration of the COVID-19 declaration under Section 564(b)(1) of the Act, 21 U.S.C. section 360bbb-3(b)(1), unless the authorization is terminated or revoked.  Performed at [facility], [address] [place], [place] [address]     Labs: CBC: Recent Labs  Lab 07/15/22 0846 07/17/22 1445 07/18/22 0500  07/19/22 0436  WBC 6.2 5.8 5.9 6.2  NEUTROABS 3.0 3.1  --   --   HGB 15.2 14.3 13.4 13.9  HCT 45.4 41.1 38.9 40.1  MCV 96 92.8 94.9 93.0  PLT 285 247 224 [id number]   Basic Metabolic Panel: Recent Labs  Lab 07/15/22 0846 07/16/22 1411 07/17/22 1445 07/18/22 0500  NA 139 136 133* 137  K 4.5 4.0 3.4* 3.9  CL 99 97 101 107  CO2 25 24 22 24   GLUCOSE 103* 81 128* 103*  BUN 22 28* 33* 26*  CREATININE 1.48* 1.42* 1.77* 1.18*  CALCIUM 9.5 9.3 9.0 8.7*  MG  --   --   --  1.8  PHOS  --   --   --  3.1   Liver Function Tests: Recent Labs  Lab 07/15/22  [id number] 07/18/22 0500  AST 33 32  ALT 30 30  ALKPHOS 123* 94  BILITOT 0.7 0.7  PROT 7.4 6.4*  ALBUMIN 4.4 3.3*   CBG: Recent Labs  Lab 07/18/22 0832 07/18/22 1137 07/18/22 1621 07/18/22 2129  GLUCAP 124* 94 91 97    Discharge time spent: greater than 30 minutes.  Signed: Deatra James, MD [facility] 07/19/2022

## 2022-07-19 NOTE — Progress Notes (Signed)
Patient discharged home today, transported home by family. Discharge summary went over with patient, patient verbalized understanding. Belongings sent home with patient.  

## 2022-07-20 LAB — HEMOGLOBIN A1C
Hgb A1c MFr Bld: 5.4 % (ref 4.8–5.6)
Mean Plasma Glucose: 108 mg/dL

## 2022-07-28 ENCOUNTER — Ambulatory Visit (INDEPENDENT_AMBULATORY_CARE_PROVIDER_SITE_OTHER): Payer: 59

## 2022-07-28 ENCOUNTER — Encounter: Payer: Self-pay | Admitting: *Deleted

## 2022-07-28 VITALS — Ht 64.0 in | Wt 162.0 lb

## 2022-07-28 DIAGNOSIS — Z0000 Encounter for general adult medical examination without abnormal findings: Secondary | ICD-10-CM

## 2022-07-28 DIAGNOSIS — Z780 Asymptomatic menopausal state: Secondary | ICD-10-CM

## 2022-07-28 DIAGNOSIS — Z1211 Encounter for screening for malignant neoplasm of colon: Secondary | ICD-10-CM

## 2022-07-28 DIAGNOSIS — Z1231 Encounter for screening mammogram for malignant neoplasm of breast: Secondary | ICD-10-CM

## 2022-07-28 NOTE — Progress Notes (Signed)
Subjective:   Gloria Thompson is a 67 y.o. female who presents for an Initial Medicare Annual Wellness Visit. I connected with  Gloria Thompson on 07/28/22 by a audio enabled telemedicine application and verified that I am speaking with the correct person using two identifiers.  Patient Location: Home  Provider Location: Home Office  I discussed the limitations of evaluation and management by telemedicine. The patient expressed understanding and agreed to proceed.  Review of Systems     Cardiac Risk Factors include: advanced age (>[id number], 28>65 women);dyslipidemia;hypertension     Objective:    Today's Vitals   07/28/22 1044  Weight: 162 lb (73.5 kg)  Height: 5\' 4"  (1.626 m)   Body mass index is 27.81 kg/m.     07/28/2022   10:47 AM 07/17/2022    5:19 PM 07/17/2022    2:29 PM 09/19/2020    6:00 PM 09/19/2020    6:12 AM 06/27/2020    8:33 AM 05/31/2020   10:31 AM  Advanced Directives  Does Patient Have a Medical Advance Directive? No  No No No No No  Would patient like information on creating a medical advance directive? No - Patient declined No - Patient declined  No - Patient declined No - Patient declined No - Patient declined     Current Medications (verified) Outpatient Encounter Medications as of 07/28/2022  Medication Sig   albuterol (VENTOLIN HFA) 108 (90 Base) MCG/ACT inhaler Inhale 1-2 puffs into the lungs every 6 (six) hours as needed for wheezing or shortness of breath.   aspirin EC 81 MG tablet Take 1 tablet (81 mg total) by mouth daily.   atorvastatin (LIPITOR) 80 MG tablet Take 0.5 tablets (40 mg total) by mouth daily.   esomeprazole (NEXIUM) 20 MG capsule Take 20 mg by mouth daily at 12 noon.   metoprolol succinate (TOPROL-XL) 100 MG 24 hr tablet TAKE 1 TABLET BY MOUTH ONCE DAILY WITH OR IMMEDIATELY FOLLOWING A MEAL   nitroGLYCERIN (NITROSTAT) 0.4 MG SL tablet Place 1 tablet (0.4 mg total) under the tongue every 5 (five) minutes as needed. (Patient taking  differently: Place 0.4 mg under the tongue every 5 (five) minutes x 3 doses as needed for chest pain (if no relief after 2nd dose, proceed to the ED for an evaluation or call [phone]).)   sertraline (ZOLOFT) 25 MG tablet Take 1 tablet (25 mg total) by mouth daily.   No facility-administered encounter medications on file as of 07/28/2022.    Allergies (verified) Amlodipine, Hydralazine, Lisinopril, Losartan, Other, Polymyxin b, Polytrim [polymyxin b-trimethoprim], Spironolactone, and Codeine   History: Past Medical History:  Diagnosis Date   Acid reflux    CAD (coronary artery disease)    a. LHC 08/10/19: 100% of prox to mid LAD s/p DES, 60% of 1st Mrgb. 09/2020: cath showing patent LAD stent with 50% 1st Mrg stenosis   Essential hypertension    History of cardiomyopathy    Normalization of LVEF 02/2020   Hyperlipidemia    Past Surgical History:  Procedure Laterality Date   APPENDECTOMY     CESAREAN SECTION  [id number]   CORONARY/GRAFT ACUTE MI REVASCULARIZATION N/A 08/10/2019   Procedure: Coronary/Graft Acute MI Revascularization;  Surgeon: Lennette BihariKelly, Thomas A, MD;  [facility];  Service: Cardiovascular;  Laterality: N/A;   kidney stones     LEFT HEART CATH AND CORONARY ANGIOGRAPHY N/A 08/10/2019   Procedure: LEFT HEART CATH AND CORONARY ANGIOGRAPHY;  Surgeon: Lennette BihariKelly, Thomas A, MD;  [facility];  Service: Cardiovascular;  Laterality: N/A;   LEFT HEART CATH AND CORONARY ANGIOGRAPHY N/A 09/19/2020   Procedure: LEFT HEART CATH AND CORONARY ANGIOGRAPHY;  Surgeon: Runell Gess, MD;  Location: [facility] INVASIVE CV LAB;  Service: Cardiovascular;  Laterality: N/A;   Family History  Problem Relation Age of Onset   Heart disease Mother    Heart attack Father    Diabetes Sister    Heart disease Brother    Hypertension Brother    Hypertension Sister    Hyperlipidemia Sister    COPD Sister    Hypertension Sister    Hyperlipidemia Sister    Heart disease Brother     Hypertension Brother    Hypertension Brother    Heart disease Brother    Heart disease Brother    Hypertension Brother    Social History   Socioeconomic History   Marital status: Widowed    Spouse name: Not on file   Number of children: Not on file   Years of education: Not on file   Highest education level: Not on file  Occupational History   Not on file  Tobacco Use   Smoking status: Never   Smokeless tobacco: Never  Vaping Use   Vaping Use: Never used  Substance and Sexual Activity   Alcohol use: No   Drug use: No   Sexual activity: Not on file  Other Topics Concern   Not on file  Social History Narrative   Not on file   Social Determinants of Health   Financial Resource Strain: Low Risk  (07/28/2022)   Overall Financial Resource Strain (CARDIA)    Difficulty of Paying Living Expenses: Not hard at all  Food Insecurity: No Food Insecurity (07/28/2022)   Hunger Vital Sign    Worried About Running Out of Food in the Last Year: Never true    Ran Out of Food in the Last Year: Never true  Transportation Needs: No Transportation Needs (07/28/2022)   PRAPARE - Administrator, Civil Service (Medical): No    Lack of Transportation (Non-Medical): No  Physical Activity: Insufficiently Active (07/28/2022)   Exercise Vital Sign    Days of Exercise per Week: 3 days    Minutes of Exercise per Session: 30 min  Stress: No Stress Concern Present (07/28/2022)   [employer] of Occupational Health - Occupational Stress Questionnaire    Feeling of Stress : Not at all  Social Connections: Moderately Integrated (07/28/2022)   Social Connection and Isolation Panel [NHANES]    Frequency of Communication with Friends and Family: More than three times a week    Frequency of Social Gatherings with Friends and Family: More than three times a week    Attends Religious Services: More than 4 times per year    Active Member of [employer] or Organizations: Yes    Attends Banker  Meetings: More than 4 times per year    Marital Status: Widowed    Tobacco Counseling Counseling given: Not Answered   Clinical Intake:  Pre-visit preparation completed: Yes  Pain : No/denies pain     Nutritional Risks: None Diabetes: No  How often do you need to have someone help you when you read instructions, pamphlets, or other written materials from your doctor or pharmacy?: 1 - Never  Diabetic?no   Interpreter Needed?: No  Information entered by :: Renie Ora, LPN   Activities of Daily Living    07/28/2022   10:48 AM 07/17/2022  5:00 PM  In your present state of health, do you have any difficulty performing the following activities:  Hearing? 0 0  Vision? 0 0  Difficulty concentrating or making decisions? 0 0  Walking or climbing stairs? 0 0  Dressing or bathing? 0 0  Doing errands, shopping? 0 0  Preparing Food and eating ? N   Using the Toilet? N   In the past six months, have you accidently leaked urine? N   Do you have problems with loss of bowel control? N   Managing your Medications? N   Managing your Finances? N   Housekeeping or managing your Housekeeping? N     Patient Care Team: Milian, Aleen Campi, FNP as PCP - General (Family Medicine)  Indicate any recent Medical Services you may have received from other than [facility] providers in the past year (date may be approximate).     Assessment:   This is a routine wellness examination for Gloria Thompson.  Hearing/Vision screen Vision Screening - Comments:: Wears rx glasses - up to date with routine eye exams with  DrCotter   Dietary issues and exercise activities discussed: Current Exercise Habits: Home exercise routine, Type of exercise: walking, Time (Minutes): 30, Frequency (Times/Week): 3, Weekly Exercise (Minutes/Week): 90, Intensity: Mild, Exercise limited by: None identified   Goals Addressed             This Visit's Progress    Exercise 3x per week (30 min per time)          Depression Screen    07/28/2022   10:46 AM 07/09/2022   11:17 AM  PHQ 2/9 Scores  PHQ - 2 Score 0 0  PHQ- 9 Score 0 5    Fall Risk    07/28/2022   10:45 AM 07/09/2022   11:22 AM  Fall Risk   Falls in the past year? 0 0  Number falls in past yr: 0 0  Injury with Fall? 0 0  Risk for fall due to : No Fall Risks No Fall Risks  Follow up Falls prevention discussed Falls evaluation completed    FALL RISK PREVENTION PERTAINING TO THE HOME:  Any stairs in or around the home? Yes  If so, are there any without handrails? No  Home free of loose throw rugs in walkways, pet beds, electrical cords, etc? Yes  Adequate lighting in your home to reduce risk of falls? Yes   ASSISTIVE DEVICES UTILIZED TO PREVENT FALLS:  Life alert? No  Use of a cane, walker or w/c? No  Grab bars in the bathroom? Yes  Shower chair or bench in shower? Yes  Elevated toilet seat or a handicapped toilet? Yes          07/28/2022   10:48 AM  6CIT Screen  What Year? 0 points  What month? 0 points  What time? 0 points  Count back from 20 0 points  Months in reverse 0 points  Repeat phrase 0 points  Total Score 0 points    Immunizations Immunization History  Administered Date(s) Administered   Td 11/01/1995    TDAP status: Due, Education has been provided regarding the importance of this vaccine. Advised may receive this vaccine at local pharmacy or Health Dept. Aware to provide a copy of the vaccination record if obtained from local pharmacy or Health Dept. Verbalized acceptance and understanding.  Flu Vaccine status: Declined, Education has been provided regarding the importance of this vaccine but patient still declined. Advised may receive this vaccine  at local pharmacy or Health Dept. Aware to provide a copy of the vaccination record if obtained from local pharmacy or Health Dept. Verbalized acceptance and understanding.  Pneumococcal vaccine status: Due, Education has been provided regarding the  importance of this vaccine. Advised may receive this vaccine at local pharmacy or Health Dept. Aware to provide a copy of the vaccination record if obtained from local pharmacy or Health Dept. Verbalized acceptance and understanding.  Covid-19 vaccine status: Declined, Education has been provided regarding the importance of this vaccine but patient still declined. Advised may receive this vaccine at local pharmacy or Health Dept.or vaccine clinic. Aware to provide a copy of the vaccination record if obtained from local pharmacy or Health Dept. Verbalized acceptance and understanding.  Qualifies for Shingles Vaccine? Yes   Zostavax completed No   Shingrix Completed?: No.    Education has been provided regarding the importance of this vaccine. Patient has been advised to call insurance company to determine out of pocket expense if they have not yet received this vaccine. Advised may also receive vaccine at local pharmacy or Health Dept. Verbalized acceptance and understanding.  Screening Tests Health Maintenance  Topic Date Due   COVID-19 Vaccine (1) Never done   Hepatitis C Screening  Never done   COLONOSCOPY (Pts 45-77yrs Insurance coverage will need to be confirmed)  Never done   DTaP/Tdap/Td (2 - Tdap) 10/31/2005   MAMMOGRAM  01/03/2015   DEXA SCAN  Never done   Zoster Vaccines- Shingrix (1 of 2) 10/09/2022 (Originally 11/02/1974)   Pneumonia Vaccine 50+ Years old (1 of 1 - PCV) 07/09/2023 (Originally 11/03/2019)   INFLUENZA VACCINE  11/19/2022   Medicare Annual Wellness (AWV)  07/28/2023   HPV VACCINES  Aged Out    Health Maintenance  Health Maintenance Due  Topic Date Due   COVID-19 Vaccine (1) Never done   Hepatitis C Screening  Never done   COLONOSCOPY (Pts 45-40yrs Insurance coverage will need to be confirmed)  Never done   DTaP/Tdap/Td (2 - Tdap) 10/31/2005   MAMMOGRAM  01/03/2015   DEXA SCAN  Never done    Colorectal cancer screening: Referral to GI placed 07/28/2022. Pt  aware the office will call re: appt.  Mammogram status: Ordered 07/28/2022. Pt provided with contact info and advised to call to schedule appt.   Bone Density status: Ordered 07/28/2022. Pt provided with contact info and advised to call to schedule appt.  Lung Cancer Screening: (Low Dose CT Chest recommended if Age 85-80 years, 30 pack-year currently smoking OR have quit w/in 15years.) does not qualify.   Lung Cancer Screening Referral: n/a  Additional Screening:  Hepatitis C Screening: does not qualify;   Vision Screening: Recommended annual ophthalmology exams for early detection of glaucoma and other disorders of the eye. Is the patient up to date with their annual eye exam?  Yes  Who is the provider or what is the name of the office in which the patient attends annual eye exams? Dr.Cotter  If pt is not established with a provider, would they like to be referred to a provider to establish care? No .   Dental Screening: Recommended annual dental exams for proper oral hygiene  Community Resource Referral / Chronic Care Management: CRR required this visit?  No   CCM required this visit?  No      Plan:     I have personally reviewed and noted the following in the patient's chart:   Medical and social history Use of  alcohol, tobacco or illicit drugs  Current medications and supplements including opioid prescriptions. Patient is not currently taking opioid prescriptions. Functional ability and status Nutritional status Physical activity Advanced directives List of other physicians Hospitalizations, surgeries, and ER visits in previous 12 months Vitals Screenings to include cognitive, depression, and falls Referrals and appointments  In addition, I have reviewed and discussed with patient certain preventive protocols, quality metrics, and best practice recommendations. A written personalized care plan for preventive services as well as general preventive health  recommendations were provided to patient.     Lorrene Reid, LPN   12/20/3298   Nurse Notes: Due TDAP Yevonne Aline Vaccine

## 2022-07-28 NOTE — Patient Instructions (Signed)
Gloria Thompson , Thank you for taking time to come for your Medicare Wellness Visit. I appreciate your ongoing commitment to your health goals. Please review the following plan we discussed and let me know if I can assist you in the future.   These are the goals we discussed:  Goals      Exercise 3x per week (30 min per time)        This is a list of the screening recommended for you and due dates:  Health Maintenance  Topic Date Due   COVID-19 Vaccine (1) Never done   Hepatitis C Screening: USPSTF Recommendation to screen - Ages 6-79 yo.  Never done   Colon Cancer Screening  Never done   DTaP/Tdap/Td vaccine (2 - Tdap) 10/31/2005   Mammogram  01/03/2015   DEXA scan (bone density measurement)  Never done   Zoster (Shingles) Vaccine (1 of 2) 10/09/2022*   Pneumonia Vaccine (1 of 1 - PCV) 07/09/2023*   Flu Shot  11/19/2022   Medicare Annual Wellness Visit  07/28/2023   HPV Vaccine  Aged Out  *Topic was postponed. The date shown is not the original due date.    Advanced directives: Advance directive discussed with you today. I have provided a copy for you to complete at home and have notarized. Once this is complete please bring a copy in to our office so we can scan it into your chart.   Conditions/risks identified: Aim for 30 minutes of exercise or brisk walking, 6-8 glasses of water, and 5 servings of fruits and vegetables each day.   Next appointment: Follow up in one year for your annual wellness visit    Preventive Care 65 Years and Older, Female Preventive care refers to lifestyle choices and visits with your health care provider that can promote health and wellness. What does preventive care include? A yearly physical exam. This is also called an annual well check. Dental exams once or twice a year. Routine eye exams. Ask your health care provider how often you should have your eyes checked. Personal lifestyle choices, including: Daily care of your teeth and gums. Regular  physical activity. Eating a healthy diet. Avoiding tobacco and drug use. Limiting alcohol use. Practicing safe sex. Taking low-dose aspirin every day. Taking vitamin and mineral supplements as recommended by your health care provider. What happens during an annual well check? The services and screenings done by your health care provider during your annual well check will depend on your age, overall health, lifestyle risk factors, and family history of disease. Counseling  Your health care provider may ask you questions about your: Alcohol use. Tobacco use. Drug use. Emotional well-being. Home and relationship well-being. Sexual activity. Eating habits. History of falls. Memory and ability to understand (cognition). Work and work Astronomer. Reproductive health. Screening  You may have the following tests or measurements: Height, weight, and BMI. Blood pressure. Lipid and cholesterol levels. These may be checked every 5 years, or more frequently if you are over 38 years old. Skin check. Lung cancer screening. You may have this screening every year starting at age 72 if you have a 30-pack-year history of smoking and currently smoke or have quit within the past 15 years. Fecal occult blood test (FOBT) of the stool. You may have this test every year starting at age 21. Flexible sigmoidoscopy or colonoscopy. You may have a sigmoidoscopy every 5 years or a colonoscopy every 10 years starting at age 80. Hepatitis C blood test. Hepatitis B  blood test. Sexually transmitted disease (STD) testing. Diabetes screening. This is done by checking your blood sugar (glucose) after you have not eaten for a while (fasting). You may have this done every 1-3 years. Bone density scan. This is done to screen for osteoporosis. You may have this done starting at age 57. Mammogram. This may be done every 1-2 years. Talk to your health care provider about how often you should have regular mammograms. Talk  with your health care provider about your test results, treatment options, and if necessary, the need for more tests. Vaccines  Your health care provider may recommend certain vaccines, such as: Influenza vaccine. This is recommended every year. Tetanus, diphtheria, and acellular pertussis (Tdap, Td) vaccine. You may need a Td booster every 10 years. Zoster vaccine. You may need this after age 23. Pneumococcal 13-valent conjugate (PCV13) vaccine. One dose is recommended after age 74. Pneumococcal polysaccharide (PPSV23) vaccine. One dose is recommended after age 43. Talk to your health care provider about which screenings and vaccines you need and how often you need them. This information is not intended to replace advice given to you by your health care provider. Make sure you discuss any questions you have with your health care provider. Document Released: 05/03/2015 Document Revised: 12/25/2015 Document Reviewed: 02/05/2015 Elsevier Interactive Patient Education  2017 [place] Prevention in the Home Falls can cause injuries. They can happen to people of all ages. There are many things you can do to make your home safe and to help prevent falls. What can I do on the outside of my home? Regularly fix the edges of walkways and driveways and fix any cracks. Remove anything that might make you trip as you walk through a door, such as a raised step or threshold. Trim any bushes or trees on the path to your home. Use bright outdoor lighting. Clear any walking paths of anything that might make someone trip, such as rocks or tools. Regularly check to see if handrails are loose or broken. Make sure that both sides of any steps have handrails. Any raised decks and porches should have guardrails on the edges. Have any leaves, snow, or ice cleared regularly. Use sand or salt on walking paths during winter. Clean up any spills in your garage right away. This includes oil or grease  spills. What can I do in the bathroom? Use night lights. Install grab bars by the toilet and in the tub and shower. Do not use towel bars as grab bars. Use non-skid mats or decals in the tub or shower. If you need to sit down in the shower, use a plastic, non-slip stool. Keep the floor dry. Clean up any water that spills on the floor as soon as it happens. Remove soap buildup in the tub or shower regularly. Attach bath mats securely with double-sided non-slip rug tape. Do not have throw rugs and other things on the floor that can make you trip. What can I do in the bedroom? Use night lights. Make sure that you have a light by your bed that is easy to reach. Do not use any sheets or blankets that are too big for your bed. They should not hang down onto the floor. Have a firm chair that has side arms. You can use this for support while you get dressed. Do not have throw rugs and other things on the floor that can make you trip. What can I do in the kitchen? Clean up any spills  right away. Avoid walking on wet floors. Keep items that you use a lot in easy-to-reach places. If you need to reach something above you, use a strong step stool that has a grab bar. Keep electrical cords out of the way. Do not use floor polish or wax that makes floors slippery. If you must use wax, use non-skid floor wax. Do not have throw rugs and other things on the floor that can make you trip. What can I do with my stairs? Do not leave any items on the stairs. Make sure that there are handrails on both sides of the stairs and use them. Fix handrails that are broken or loose. Make sure that handrails are as long as the stairways. Check any carpeting to make sure that it is firmly attached to the stairs. Fix any carpet that is loose or worn. Avoid having throw rugs at the top or bottom of the stairs. If you do have throw rugs, attach them to the floor with carpet tape. Make sure that you have a light switch at the  top of the stairs and the bottom of the stairs. If you do not have them, ask someone to add them for you. What else can I do to help prevent falls? Wear shoes that: Do not have high heels. Have rubber bottoms. Are comfortable and fit you well. Are closed at the toe. Do not wear sandals. If you use a stepladder: Make sure that it is fully opened. Do not climb a closed stepladder. Make sure that both sides of the stepladder are locked into place. Ask someone to hold it for you, if possible. Clearly mark and make sure that you can see: Any grab bars or handrails. First and last steps. Where the edge of each step is. Use tools that help you move around (mobility aids) if they are needed. These include: Canes. Walkers. Scooters. Crutches. Turn on the lights when you go into a dark area. Replace any light bulbs as soon as they burn out. Set up your furniture so you have a clear path. Avoid moving your furniture around. If any of your floors are uneven, fix them. If there are any pets around you, be aware of where they are. Review your medicines with your doctor. Some medicines can make you feel dizzy. This can increase your chance of falling. Ask your doctor what other things that you can do to help prevent falls. This information is not intended to replace advice given to you by your health care provider. Make sure you discuss any questions you have with your health care provider. Document Released: 01/31/2009 Document Revised: 09/12/2015 Document Reviewed: 05/11/2014 Elsevier Interactive Patient Education  2017 [employer].

## 2022-08-06 ENCOUNTER — Ambulatory Visit: Payer: 59 | Admitting: Family Medicine

## 2022-08-07 ENCOUNTER — Ambulatory Visit (INDEPENDENT_AMBULATORY_CARE_PROVIDER_SITE_OTHER): Payer: 59 | Admitting: Family Medicine

## 2022-08-07 ENCOUNTER — Ambulatory Visit (INDEPENDENT_AMBULATORY_CARE_PROVIDER_SITE_OTHER): Payer: 59

## 2022-08-07 ENCOUNTER — Encounter: Payer: Self-pay | Admitting: Family Medicine

## 2022-08-07 VITALS — BP 116/70 | HR 65 | Temp 98.2°F | Ht 64.0 in | Wt 168.0 lb

## 2022-08-07 DIAGNOSIS — I10 Essential (primary) hypertension: Secondary | ICD-10-CM | POA: Diagnosis not present

## 2022-08-07 DIAGNOSIS — Z780 Asymptomatic menopausal state: Secondary | ICD-10-CM

## 2022-08-07 DIAGNOSIS — E782 Mixed hyperlipidemia: Secondary | ICD-10-CM

## 2022-08-07 DIAGNOSIS — F331 Major depressive disorder, recurrent, moderate: Secondary | ICD-10-CM | POA: Diagnosis not present

## 2022-08-07 DIAGNOSIS — N179 Acute kidney failure, unspecified: Secondary | ICD-10-CM

## 2022-08-07 DIAGNOSIS — F411 Generalized anxiety disorder: Secondary | ICD-10-CM | POA: Diagnosis not present

## 2022-08-07 DIAGNOSIS — K219 Gastro-esophageal reflux disease without esophagitis: Secondary | ICD-10-CM

## 2022-08-07 LAB — CBC WITH DIFFERENTIAL/PLATELET
Basophils Absolute: 0 10*3/uL (ref 0.0–0.2)
Basos: 1 %
EOS (ABSOLUTE): 0.3 10*3/uL (ref 0.0–0.4)
Eos: 5 %
Hematocrit: 38.3 % (ref 34.0–46.6)
Hemoglobin: 13.4 g/dL (ref 11.1–15.9)
Immature Grans (Abs): 0 10*3/uL (ref 0.0–0.1)
Immature Granulocytes: 0 %
Lymphocytes Absolute: 1.8 10*3/uL (ref 0.7–3.1)
Lymphs: 35 %
MCH: 32.8 pg (ref 26.6–33.0)
MCHC: 35 g/dL (ref 31.5–35.7)
MCV: 94 fL (ref 79–97)
Monocytes Absolute: 0.4 10*3/uL (ref 0.1–0.9)
Monocytes: 8 %
Neutrophils Absolute: 2.7 10*3/uL (ref 1.4–7.0)
Neutrophils: 51 %
Platelets: 225 10*3/uL (ref 150–450)
RBC: 4.09 x10E6/uL (ref 3.77–5.28)
RDW: 11 % — ABNORMAL LOW (ref 11.7–15.4)
WBC: 5.3 10*3/uL (ref 3.4–10.8)

## 2022-08-07 LAB — CMP14+EGFR
ALT: 23 IU/L (ref 0–32)
AST: 24 IU/L (ref 0–40)
Albumin/Globulin Ratio: 1.6 (ref 1.2–2.2)
Albumin: 4.2 g/dL (ref 3.9–4.9)
Alkaline Phosphatase: 100 IU/L (ref 44–121)
BUN/Creatinine Ratio: 19 (ref 12–28)
BUN: 20 mg/dL (ref 8–27)
Bilirubin Total: 0.5 mg/dL (ref 0.0–1.2)
CO2: 22 mmol/L (ref 20–29)
Calcium: 9.1 mg/dL (ref 8.7–10.3)
Chloride: 103 mmol/L (ref 96–106)
Creatinine, Ser: 1.06 mg/dL — ABNORMAL HIGH (ref 0.57–1.00)
Globulin, Total: 2.7 g/dL (ref 1.5–4.5)
Glucose: 92 mg/dL (ref 70–99)
Potassium: 4.8 mmol/L (ref 3.5–5.2)
Sodium: 140 mmol/L (ref 134–144)
Total Protein: 6.9 g/dL (ref 6.0–8.5)
eGFR: 58 mL/min/{1.73_m2} — ABNORMAL LOW (ref >59)

## 2022-08-07 MED ORDER — ESOMEPRAZOLE MAGNESIUM 20 MG PO CPDR: 20.0000 mg | DELAYED_RELEASE_CAPSULE | Freq: Every day | ORAL | 0 refills | Status: AC

## 2022-08-07 MED ORDER — SERTRALINE HCL 25 MG PO TABS: 25.0000 mg | ORAL_TABLET | Freq: Every day | ORAL | 0 refills | Status: DC

## 2022-08-07 NOTE — Progress Notes (Signed)
Acute Office Visit  Subjective:  Patient ID: Gloria Thompson, female    DOB: 26-Jun-1955, 67 y.o.   MRN: [id number]  Chief Complaint  Patient presents with   Medical Management of Chronic Issues    4 week follow up    HPI Patient is in today for follow up of chronic conditions and follow up on labs  She is fasting today for repeat labs from her recent AKI.  Denies any symptoms. States that her BP has been fluctuating. At home she is getting readings of 140/80s.   Hypertension  She reports that she is taking her BP and her cholesterol medications. She is not currently taking Metoprolol.  Denies changes to her vision, palpitation, SOB, and leg swelling.   HLD  She is currently taking lipitor. It was reduced to 40 mg during her admission to the hospital. She endorses occasional leg and muscle pain.   Depression  She has not been taking her zoloft. She was taking it while she was admitted. She says that she was afraid to take it when she came home because she did not want to hurt her kidneys further. However, she feels that she needs it.   GERD Reports that she is taking nexium. States that she sometimes feels that it makes her sick on her stomach. Recently it has started bothering her. She reports that she is taking it before she eats. Denies trouble swallowing. Denies melena, hematochezia.   ROS As per HPI   Objective:  BP 116/70   Pulse 65   Temp 98.2 F (36.8 C)   Ht  (1.626 m)   Wt 168 lb (76.2 kg)   SpO2 98%   BMI 28.84 kg/m    Physical Exam Constitutional:      General: She is not in acute distress.    Appearance: Normal appearance. She is not ill-appearing, toxic-appearing or diaphoretic.  Cardiovascular:     Rate and Rhythm: Normal rate.     Pulses: Normal pulses.     Heart sounds: Normal heart sounds. No murmur heard.    No gallop.  Pulmonary:     Effort: Pulmonary effort is normal. No respiratory distress.     Breath sounds: Normal breath sounds.  No stridor. No wheezing, rhonchi or rales.  Skin:    General: Skin is warm.     Capillary Refill: Capillary refill takes less than 2 seconds.  Neurological:     General: No focal deficit present.     Mental Status: She is alert and oriented to person, place, and time. Mental status is at baseline.     Motor: No weakness.  Psychiatric:        Mood and Affect: Mood normal.        Behavior: Behavior normal.        Thought Content: Thought content normal.        Judgment: Judgment normal.       08/07/2022   10:20 AM 07/28/2022   10:46 AM 07/09/2022   11:17 AM  Depression screen PHQ 2/9  Decreased Interest 0 0 0  Down, Depressed, Hopeless 2 0 0  PHQ - 2 Score 2 0 0  Altered sleeping 2 0 2  Tired, decreased energy 1 0 1  Change in appetite 0 0 1  Feeling bad or failure about yourself  0 0 0  Trouble concentrating 0 0 1  Moving slowly or fidgety/restless 1 0 0  Suicidal thoughts 0 0 0  PHQ-9 Score  6 0 5  Difficult doing work/chores Not difficult at all Not difficult at all Not difficult at all      08/07/2022   10:20 AM 07/09/2022   11:20 AM  GAD 7 : Generalized Anxiety Score  Nervous, Anxious, on Edge 0 2  Control/stop worrying 0 1  Worry too much - different things 1 2  Trouble relaxing 0 1  Restless 1 1  Easily annoyed or irritable 0 0  Afraid - awful might happen 1 2  Total GAD 7 Score 3 9  Anxiety Difficulty Not difficult at all Somewhat difficult    Assessment & Plan:  1. Acute kidney injury Will repeat labs as below. Per hospital discharge they would like repeat LFTs in 6-8 weeks as well.  - CBC with Differential/Platelet - CMP14+EGFR  2. Moderate episode of recurrent major depressive disorder 3. Generalized anxiety disorder Educated patient on the use of SSRI and that she should not start and stop medication as below. Instructed her that if she would like to be on medication as below that she needs to trial it for at least 3 months with consistency before deciding  to abort plan.  - sertraline (ZOLOFT) 25 MG tablet; Take 1 tablet (25 mg total) by mouth daily.  Dispense: 30 tablet; Refill: 0  4. Primary hypertension At goal today. Patient is concerned today that her home monitor is not accurate. Instructed her to bring in her monitor for comparison with our monitors. Instructed her to follow up with her cardiologist because she is taking her medications differently than prescribed. She is no longer taking chlorthalidone.   5. Gastroesophageal reflux disease, unspecified whether esophagitis present Refill placed as below. Well controlled on current regimen.  - esomeprazole (NEXIUM) 20 MG capsule; Take 1 capsule (20 mg total) by mouth daily at 12 noon.  Dispense: 30 capsule; Refill: 0  6. Mixed hyperlipidemia Patient has slight complaint of leg cramping, which may be due to statin therapy. It is tolerable for now and she is comfortable continuing medication at this time. Monitoring electrolytes as above to evaluate for other causes of leg cramping. She was reduced to 40 mg of Lipitor while in the hospital. Instructed patient to follow up with cardiology for recommendations of how to continue taking statin.  The above assessment and management plan was discussed with the patient. The patient verbalized understanding of and has agreed to the management plan using shared-decision making. Patient is aware to call the clinic if they develop any new symptoms or if symptoms fail to improve or worsen. Patient is aware when to return to the clinic for a follow-up visit. Patient educated on when it is appropriate to go to the emergency department.   Neale Burly, DNP-FNP [facility] [address] [place], [place] [address] [phone]

## 2022-09-02 ENCOUNTER — Encounter: Payer: Self-pay | Admitting: Family Medicine

## 2022-09-02 ENCOUNTER — Ambulatory Visit: Payer: 59 | Admitting: Family Medicine

## 2022-09-02 ENCOUNTER — Other Ambulatory Visit: Payer: Self-pay | Admitting: Physician Assistant

## 2022-10-13 DIAGNOSIS — H04123 Dry eye syndrome of bilateral lacrimal glands: Secondary | ICD-10-CM | POA: Diagnosis not present

## 2022-10-14 ENCOUNTER — Ambulatory Visit
Admission: RE | Admit: 2022-10-14 | Discharge: 2022-10-14 | Disposition: A | Discharge status: Home / Self Care | Payer: 59 | Source: Ambulatory Visit | Attending: Family Medicine | Admitting: Family Medicine

## 2022-10-14 ENCOUNTER — Other Ambulatory Visit: Payer: Self-pay | Admitting: Family Medicine

## 2022-10-14 DIAGNOSIS — Z0000 Encounter for general adult medical examination without abnormal findings: Secondary | ICD-10-CM

## 2022-10-14 DIAGNOSIS — Z1231 Encounter for screening mammogram for malignant neoplasm of breast: Secondary | ICD-10-CM

## 2022-10-14 DIAGNOSIS — Z1211 Encounter for screening for malignant neoplasm of colon: Secondary | ICD-10-CM

## 2022-10-14 DIAGNOSIS — Z780 Asymptomatic menopausal state: Secondary | ICD-10-CM

## 2022-12-30 IMAGING — DX DG CHEST 1V PORT
1 series · 1 of 1 positions shown · non-contrast
Comparison: August 09, 2019

CLINICAL DATA: Shortness of breath

EXAM:
PORTABLE CHEST 1 VIEW

[chest ap]
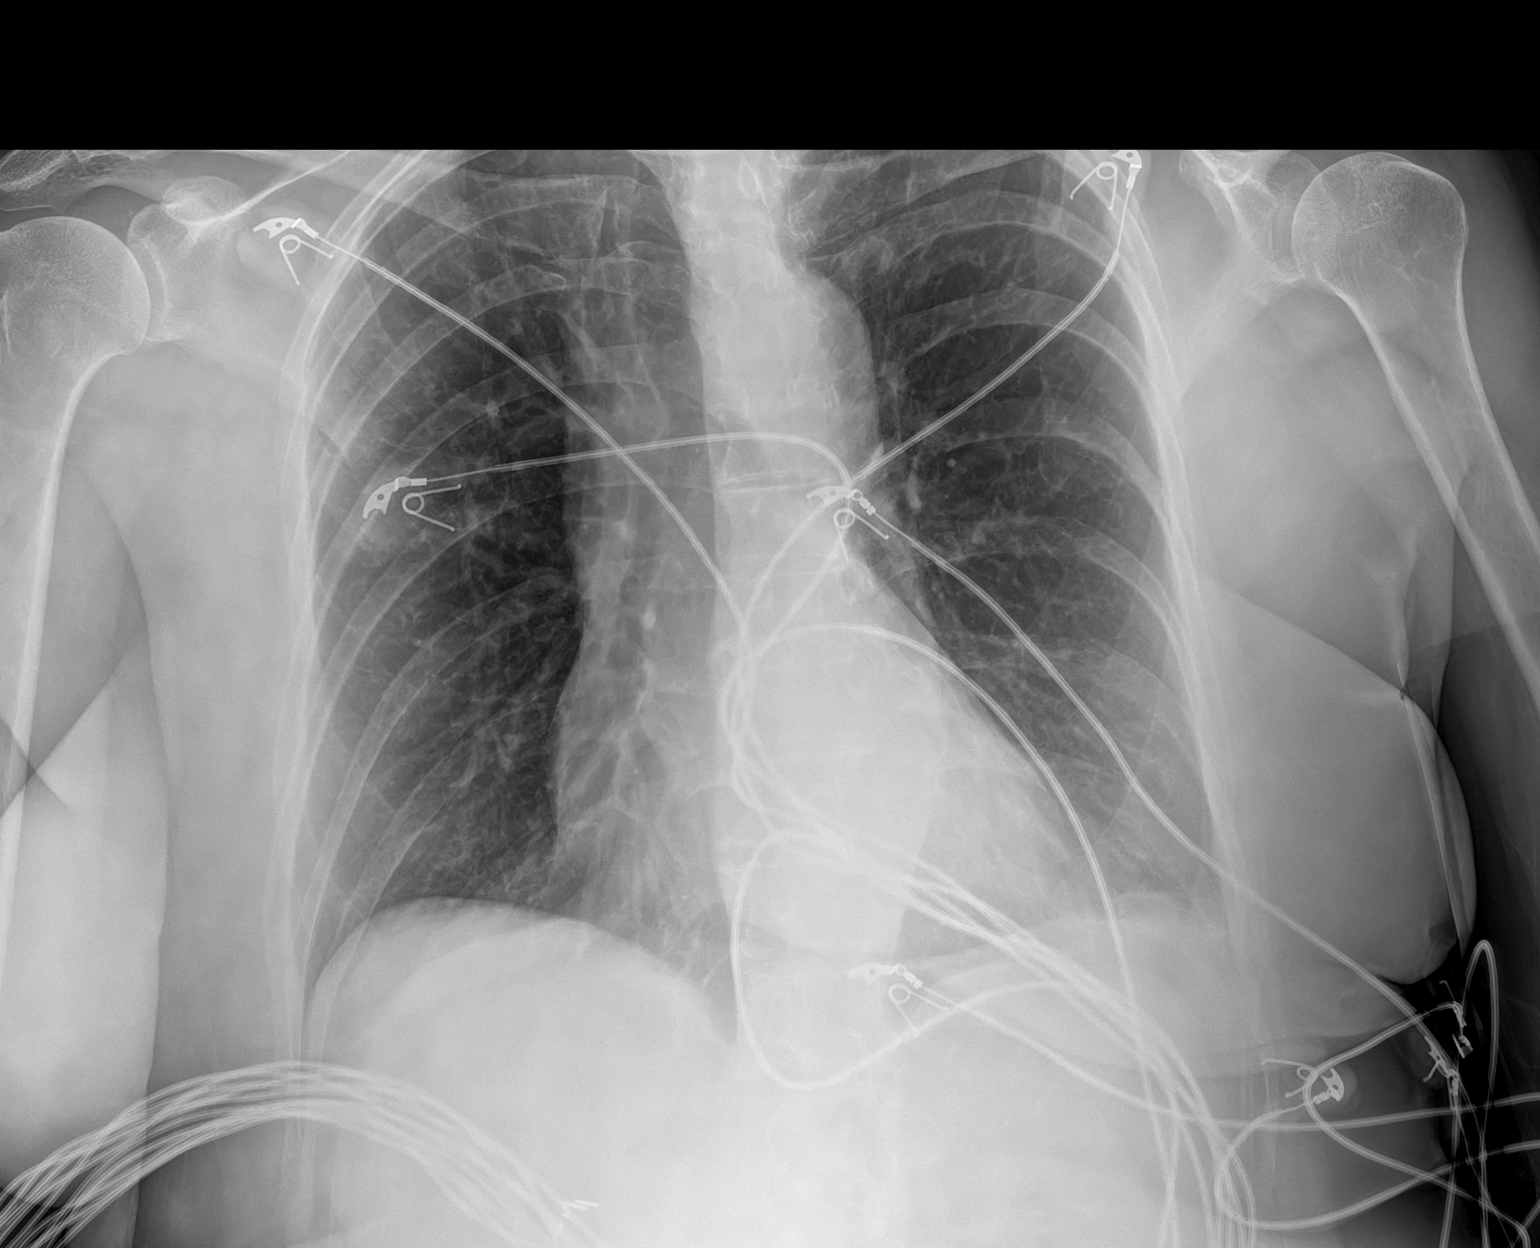

[1 of 1 positions shown; findings below may reference images not displayed]

FINDINGS: Lungs are clear. Heart size and pulmonary vascularity are normal. No
adenopathy. No bone lesions.
IMPRESSION: Lungs clear.  Cardiac silhouette normal.

## 2023-01-27 ENCOUNTER — Encounter: Payer: Self-pay | Admitting: *Deleted

## 2023-03-11 DIAGNOSIS — S7011XA Contusion of right thigh, initial encounter: Secondary | ICD-10-CM | POA: Diagnosis not present

## 2023-03-11 DIAGNOSIS — R52 Pain, unspecified: Secondary | ICD-10-CM | POA: Diagnosis not present

## 2023-03-11 DIAGNOSIS — I8391 Asymptomatic varicose veins of right lower extremity: Secondary | ICD-10-CM | POA: Diagnosis not present

## 2023-03-11 DIAGNOSIS — M79604 Pain in right leg: Secondary | ICD-10-CM | POA: Diagnosis not present

## 2023-03-13 DIAGNOSIS — R070 Pain in throat: Secondary | ICD-10-CM | POA: Diagnosis not present

## 2023-03-13 DIAGNOSIS — R0989 Other specified symptoms and signs involving the circulatory and respiratory systems: Secondary | ICD-10-CM | POA: Diagnosis not present

## 2023-03-13 DIAGNOSIS — J988 Other specified respiratory disorders: Secondary | ICD-10-CM | POA: Diagnosis not present

## 2023-03-13 DIAGNOSIS — B9689 Other specified bacterial agents as the cause of diseases classified elsewhere: Secondary | ICD-10-CM | POA: Diagnosis not present

## 2023-03-13 DIAGNOSIS — R051 Acute cough: Secondary | ICD-10-CM | POA: Diagnosis not present

## 2023-04-08 ENCOUNTER — Other Ambulatory Visit (HOSPITAL_BASED_OUTPATIENT_CLINIC_OR_DEPARTMENT_OTHER): Payer: Self-pay

## 2023-04-11 NOTE — Patient Instructions (Signed)
        Great to see you today.  I have refilled the medication(s) we provide.   Call Cardiology:  [facility] at [facility] [address] [place], [place] [address] [phone]     If labs were collected, we will inform you of lab results once received either by echart message or telephone call.   - echart message- for normal results that have been seen by the patient already.   - telephone call: abnormal results or if patient has not viewed results in their echart.   - Please take medications as prescribed. - Follow up with your primary health provider if any health concerns arises. - If symptoms worsen please contact your primary care provider and/or visit the emergency department.

## 2023-04-11 NOTE — Progress Notes (Unsigned)
   New Patient Office Visit   Subjective   Patient ID: Gloria Thompson, female    DOB: 10-09-1955  Age: 67 y.o. MRN: [id number]  CC: No chief complaint on file.   HPI Gloria Thompson 67 year old female, presents to establish care. She  has a past medical history of Acid reflux, CAD (coronary artery disease), Essential hypertension, History of cardiomyopathy, and Hyperlipidemia.  Leg Pain   Shortness of Breath Associated symptoms include leg pain.      Outpatient Encounter Medications as of 04/12/2023  Medication Sig   albuterol (VENTOLIN HFA) 108 (90 Base) MCG/ACT inhaler Inhale 1-2 puffs into the lungs every 6 (six) hours as needed for wheezing or shortness of breath.   aspirin EC 81 MG tablet Take 1 tablet (81 mg total) by mouth daily.   atorvastatin (LIPITOR) 80 MG tablet Take 0.5 tablets (40 mg total) by mouth daily.   esomeprazole (NEXIUM) 20 MG capsule Take 1 capsule (20 mg total) by mouth daily at 12 noon.   metoprolol succinate (TOPROL-XL) 100 MG 24 hr tablet TAKE 1 TABLET BY MOUTH ONCE DAILY WITH OR IMMEDIATELY FOLLOWING A MEAL   nitroGLYCERIN (NITROSTAT) 0.4 MG SL tablet Place 1 tablet (0.4 mg total) under the tongue every 5 (five) minutes as needed. (Patient taking differently: Place 0.4 mg under the tongue every 5 (five) minutes x 3 doses as needed for chest pain (if no relief after 2nd dose, proceed to the ED for an evaluation or call [phone]).)   sertraline (ZOLOFT) 25 MG tablet Take 1 tablet (25 mg total) by mouth daily.   No facility-administered encounter medications on file as of 04/12/2023.    Past Surgical History:  Procedure Laterality Date   APPENDECTOMY     CESAREAN SECTION  [id number]   CORONARY/GRAFT ACUTE MI REVASCULARIZATION N/A 08/10/2019   Procedure: Coronary/Graft Acute MI Revascularization;  Surgeon: Lennette Bihari, MD;  Location: [facility] INVASIVE CV LAB;  Service: Cardiovascular;  Laterality: N/A;   kidney stones     LEFT HEART CATH AND CORONARY  ANGIOGRAPHY N/A 08/10/2019   Procedure: LEFT HEART CATH AND CORONARY ANGIOGRAPHY;  Surgeon: Lennette Bihari, MD;  Location: [facility] INVASIVE CV LAB;  Service: Cardiovascular;  Laterality: N/A;   LEFT HEART CATH AND CORONARY ANGIOGRAPHY N/A 09/19/2020   Procedure: LEFT HEART CATH AND CORONARY ANGIOGRAPHY;  Surgeon: Runell Gess, MD;  Location: [facility] INVASIVE CV LAB;  Service: Cardiovascular;  Laterality: N/A;    Review of Systems  Respiratory:  Positive for shortness of breath.       Objective    There were no vitals taken for this visit.  Physical Exam    Assessment & Plan:  There are no diagnoses linked to this encounter.  No follow-ups on file.   Cruzita Lederer Newman Nip, FNP

## 2023-04-12 ENCOUNTER — Ambulatory Visit (INDEPENDENT_AMBULATORY_CARE_PROVIDER_SITE_OTHER): Payer: 59 | Admitting: Family Medicine

## 2023-04-12 ENCOUNTER — Ambulatory Visit (HOSPITAL_COMMUNITY)
Admission: RE | Admit: 2023-04-12 | Discharge: 2023-04-12 | Disposition: A | Discharge status: Home / Self Care | Payer: 59 | Source: Ambulatory Visit | Attending: Family Medicine | Admitting: Family Medicine

## 2023-04-12 ENCOUNTER — Encounter: Payer: Self-pay | Admitting: Family Medicine

## 2023-04-12 VITALS — BP 183/100 | HR 65 | Ht 64.0 in | Wt 167.1 lb

## 2023-04-12 DIAGNOSIS — D509 Iron deficiency anemia, unspecified: Secondary | ICD-10-CM | POA: Diagnosis not present

## 2023-04-12 DIAGNOSIS — Z1211 Encounter for screening for malignant neoplasm of colon: Secondary | ICD-10-CM

## 2023-04-12 DIAGNOSIS — M79605 Pain in left leg: Secondary | ICD-10-CM

## 2023-04-12 DIAGNOSIS — R0602 Shortness of breath: Secondary | ICD-10-CM

## 2023-04-12 DIAGNOSIS — M79606 Pain in leg, unspecified: Secondary | ICD-10-CM | POA: Insufficient documentation

## 2023-04-12 DIAGNOSIS — E782 Mixed hyperlipidemia: Secondary | ICD-10-CM

## 2023-04-12 DIAGNOSIS — M79604 Pain in right leg: Secondary | ICD-10-CM | POA: Diagnosis not present

## 2023-04-12 DIAGNOSIS — R252 Cramp and spasm: Secondary | ICD-10-CM | POA: Diagnosis not present

## 2023-04-12 DIAGNOSIS — I10 Essential (primary) hypertension: Secondary | ICD-10-CM

## 2023-04-12 MED ORDER — LEVOCETIRIZINE DIHYDROCHLORIDE 5 MG PO TABS: 5.0000 mg | ORAL_TABLET | Freq: Every evening | ORAL | 1 refills | Status: AC

## 2023-04-12 MED ORDER — ALBUTEROL SULFATE HFA 108 (90 BASE) MCG/ACT IN AERS: 2.0000 | INHALATION_SPRAY | Freq: Four times a day (QID) | RESPIRATORY_TRACT | 2 refills | Status: AC | PRN

## 2023-04-12 MED ORDER — GABAPENTIN 300 MG PO CAPS: 300.0000 mg | ORAL_CAPSULE | Freq: Two times a day (BID) | ORAL | 2 refills | Status: AC | PRN

## 2023-04-12 MED ORDER — CLONIDINE HCL 0.1 MG PO TABS: 0.1000 mg | ORAL_TABLET | Freq: Two times a day (BID) | ORAL | 3 refills | Status: AC

## 2023-04-12 NOTE — Assessment & Plan Note (Signed)
Labs ordered to rule out any deficiency Trial on gabapentin 300 mg PRN Advise rest and elevate the affected leg to reduce swelling and discomfort. Apply a cold pack to reduce inflammation, followed by a warm compress to relax muscles. Over-the-counter pain relievers like acetaminophen or ibuprofen can also help manage pain

## 2023-04-12 NOTE — Assessment & Plan Note (Addendum)
Chest Xray ordered for further evaluation  Albuterol inhaler PRN Discussed to Avoid known triggers like smoke, allergens, or strenuous activity, and ensure your environment has good ventilation. If symptoms persist or worsen follow up or visit ED

## 2023-04-12 NOTE — Assessment & Plan Note (Signed)
Vitals:   04/12/23 1309 04/12/23 1332  BP: (!) 163/92 (!) 183/100  Not controlled, but has allergies to multiple blood pressure medications including Amlodipine, Hydralazine, Lisinopril, Losartan Patient reports taking metoprolol100 mg once daily Start Clonidine 0.1 mg twice daily Advise to follow up with her Cardiology for management Discussed with  patient to monitor their blood pressure regularly and maintain a heart-healthy diet rich in fruits, vegetables, whole grains, and low-fat dairy, while reducing sodium intake to less than 2,300 mg per day. Regular physical activity, such as 30 minutes of moderate exercise most days of the week, will help lower blood pressure and improve overall cardiovascular health. Avoiding smoking, limiting alcohol consumption, and managing stress. Take  prescribed medication, & take it as directed and avoid skipping doses. Seek emergency care if your blood pressure is (over 180/100) or you experience chest pain, shortness of breath, or sudden vision changes.Patient verbalizes understanding regarding plan of care and all questions answered.

## 2023-04-13 LAB — IRON,TIBC AND FERRITIN PANEL
Ferritin: 62 ng/mL (ref 15–150)
Iron Saturation: 40 % (ref 15–55)
Iron: 126 ug/dL (ref 27–139)
Total Iron Binding Capacity: 317 ug/dL (ref 250–450)
UIBC: 191 ug/dL (ref 118–369)

## 2023-04-13 LAB — LIPID PANEL
Chol/HDL Ratio: 2.7 {ratio} (ref 0.0–4.4)
Cholesterol, Total: 134 mg/dL (ref 100–199)
HDL: 50 mg/dL (ref >39)
LDL Chol Calc (NIH): 68 mg/dL (ref 0–99)
Triglycerides: 79 mg/dL (ref 0–149)
VLDL Cholesterol Cal: 16 mg/dL (ref 5–40)

## 2023-04-13 LAB — MAGNESIUM: Magnesium: 1.8 mg/dL (ref 1.6–2.3)

## 2023-04-13 LAB — VITAMIN B12: Vitamin B-12: 570 pg/mL (ref 232–1245)

## 2023-04-13 NOTE — Progress Notes (Signed)
Please inform patient all labs are normal.

## 2023-05-12 DIAGNOSIS — Z1211 Encounter for screening for malignant neoplasm of colon: Secondary | ICD-10-CM | POA: Diagnosis not present

## 2023-05-18 LAB — COLOGUARD: COLOGUARD: NEGATIVE

## 2023-07-29 NOTE — Progress Notes (Signed)
 This encounter was created in error - please disregard.

## 2023-08-11 ENCOUNTER — Ambulatory Visit: Payer: 59 | Admitting: Family Medicine

## 2023-08-17 ENCOUNTER — Encounter: Payer: Self-pay | Admitting: Family Medicine

## 2023-09-07 ENCOUNTER — Other Ambulatory Visit: Payer: Self-pay | Admitting: Physician Assistant

## 2023-10-08 ENCOUNTER — Telehealth: Payer: Self-pay | Admitting: Physician Assistant

## 2023-10-08 MED ORDER — METOPROLOL SUCCINATE ER 100 MG PO TB24: ORAL_TABLET | ORAL | 0 refills | Status: DC

## 2023-10-08 MED ORDER — ATORVASTATIN CALCIUM 80 MG PO TABS: 40.0000 mg | ORAL_TABLET | Freq: Every day | ORAL | 0 refills | Status: DC

## 2023-10-08 NOTE — Telephone Encounter (Signed)
 Pt's medications was sent to pt's pharmacy as requested. Confirmation received.

## 2023-10-08 NOTE — Telephone Encounter (Signed)
*  STAT* If patient is at the pharmacy, call can be transferred to refill team.   1. Which medications need to be refilled? (please list name of each medication and dose if known)   atorvastatin  (LIPITOR ) 80 MG tablet   metoprolol  succinate (TOPROL -XL) 100 MG 24 hr tablet     2. Which pharmacy/location (including street and city if local pharmacy) is medication to be sent to? [id number] - [place], [place] - [address]    3. Do they need a 30 day or 90 day supply? 90

## 2023-10-19 ENCOUNTER — Telehealth: Payer: Self-pay | Admitting: Cardiology

## 2023-10-19 NOTE — Telephone Encounter (Signed)
 I advise patient to call her pcp at [facility] and provided her with the phone number

## 2023-10-19 NOTE — Telephone Encounter (Signed)
 Pt c/o medication issue:  1. Name of Medication:   esomeprazole  (NEXIUM ) 20 MG capsule (Expired)    2. How are you currently taking this medication (dosage and times per day)?  Take 1 capsule (20 mg total) by mouth daily at 12 noon.       3. Are you having a reaction (difficulty breathing--STAT)? No  4. What is your medication issue? Pt is requesting a callback regarding her wanting to start this medication again. She stated she was taking the over the counter tablets but they've been discontinued. Please advise

## 2023-12-06 ENCOUNTER — Ambulatory Visit: Attending: Cardiology | Admitting: Cardiology

## 2023-12-06 ENCOUNTER — Encounter: Payer: Self-pay | Admitting: Cardiology

## 2023-12-06 VITALS — BP 148/88 | HR 58 | Ht 65.0 in | Wt 161.8 lb

## 2023-12-06 DIAGNOSIS — M791 Myalgia, unspecified site: Secondary | ICD-10-CM

## 2023-12-06 DIAGNOSIS — I2510 Atherosclerotic heart disease of native coronary artery without angina pectoris: Secondary | ICD-10-CM | POA: Diagnosis not present

## 2023-12-06 DIAGNOSIS — T466X5D Adverse effect of antihyperlipidemic and antiarteriosclerotic drugs, subsequent encounter: Secondary | ICD-10-CM

## 2023-12-06 DIAGNOSIS — I10 Essential (primary) hypertension: Secondary | ICD-10-CM

## 2023-12-06 DIAGNOSIS — T466X5A Adverse effect of antihyperlipidemic and antiarteriosclerotic drugs, initial encounter: Secondary | ICD-10-CM

## 2023-12-06 DIAGNOSIS — I25119 Atherosclerotic heart disease of native coronary artery with unspecified angina pectoris: Secondary | ICD-10-CM

## 2023-12-06 DIAGNOSIS — E782 Mixed hyperlipidemia: Secondary | ICD-10-CM

## 2023-12-06 MED ORDER — ROSUVASTATIN CALCIUM 5 MG PO TABS: 5.0000 mg | ORAL_TABLET | Freq: Every day | ORAL | 3 refills | Status: AC

## 2023-12-06 NOTE — Patient Instructions (Signed)
 Medication Instructions:   STOP Atorvastatin    In 2 weeks: START Crestor  5 mg daily  Labwork: None today  Testing/Procedures: None today  Follow-Up: 6 months  Any Other Special Instructions Will Be Listed Below (If Applicable).  If you need a refill on your cardiac medications before your next appointment, please call your pharmacy.

## 2023-12-06 NOTE — Progress Notes (Signed)
    Cardiology Office Note  Date: 12/06/2023   ID: Gloria Thompson, DOB May 10, 1955, MRN [id number]  History of Present Illness: Gloria Thompson is a 68 y.o. female last seen in March 2024 by Ms. Parthenia RIGGERS, I reviewed her note.  Our last encounter was in 2022.  She is here today for a follow-up visit.  Reports no angina at current level of activity, stable NYHA class II dyspnea.  She does experience occasional palpitations, no dizziness or syncope.  We went over her medications.  She has a number of medication intolerances/allergies as listed in the chart.  This largely includes antihypertensive medications.  Also reporting trouble with headaches and leg pain on Lipitor .  We discussed other treatment options.  I reviewed her lab work from December 2024, LDL was 68 at that time.  I reviewed her ECG today which shows normal sinus rhythm with old anterior infarct pattern.  Physical Exam: VS:  BP (!) 148/88 (BP Location: Left Arm, Cuff Size: Normal)   Pulse (!) 58   Ht 5' 5 (1.651 m)   Wt 161 lb 12.8 oz (73.4 kg)   SpO2 95%   BMI 26.92 kg/m , BMI Body mass index is 26.92 kg/m.  Wt Readings from Last 3 Encounters:  12/06/23 161 lb 12.8 oz (73.4 kg)  04/12/23 167 lb 1.3 oz (75.8 kg)  08/07/22 168 lb (76.2 kg)    General: Patient appears comfortable at rest. HEENT: Conjunctiva and lids normal,. Neck: Supple, no elevated JVP or carotid bruits. Lungs: Clear to auscultation, nonlabored breathing at rest. Cardiac: Regular rate and rhythm, no S3 or significant systolic murmur. Extremities: No pitting edema.  ECG:  An ECG dated 09/09/2021 was personally reviewed today and demonstrated:  Sinus rhythm.  Labwork: April 2024: BUN 20, creatinine 1.06, GFR 58, potassium 4.8, AST 24, ALT 23, hemoglobin 13.4, platelets 225 04/12/2023: Magnesium  1.8     Component Value Date/Time   CHOL 134 04/12/2023 1433   TRIG 79 04/12/2023 1433   HDL 50 04/12/2023 1433   CHOLHDL 2.7 04/12/2023 1433    CHOLHDL 3.1 09/09/2021 1206   VLDL 29 09/09/2021 1206   LDLCALC 68 04/12/2023 1433   Other Studies Reviewed Today:  No interval cardiac testing for review today.  Assessment and Plan:  1.  CAD status post DES to the proximal to mid LAD in 2021.  Follow-up cardiac catheterization in June 2022 showed patent stent site with 50% first OM stenosis managed medically.  ECG reviewed and stable.  She is on aspirin  81 mg daily and remains symptomatically stable without recurrent angina.  2.  Primary hypertension.  Currently on Toprol  XL 100 mg daily.  She has allergies/intolerances to amlodipine , hydralazine , lisinopril , losartan , spironolactone , and clonidine .  3.  Mixed hyperlipidemia.  LDL 68 in December 2024.  She is on Lipitor  40 mg daily, reporting potential side effects as discussed above.  Plan to stop Lipitor  and if symptoms improve over the next few weeks, initiate trial of Crestor  5 mg daily.  We have discussed other treatment options as well.  4.  HFrecEF, LVEF 60 to 65% by echocardiogram in June 2022.  Disposition:  Follow up 6 months.  Signed, Jayson JUDITHANN Sierras, M.D., F.A.C.C. [facility]

## 2024-01-24 ENCOUNTER — Other Ambulatory Visit: Payer: Self-pay | Admitting: Cardiology

## 2024-03-21 ENCOUNTER — Telehealth: Payer: Self-pay | Admitting: Family Medicine

## 2024-03-21 DIAGNOSIS — E782 Mixed hyperlipidemia: Secondary | ICD-10-CM

## 2024-03-21 NOTE — Progress Notes (Addendum)
 Pharmacy Quality Measure Review  This patient is appearing on a report for being at risk of failing the adherence measure for cholesterol (statin) medications this calendar year.   Medication: Rosuvastatin  5mg  Last fill date: 09/03 for 90 day supply  Patient recently switched from atorvastatin  to rosuvastatin , by direction of her cardiologist (Dr. Debera). Spoke with patient, states she has about the same side effects as when she took atorvastatin . Experiences headaches which occur, within a few minutes after taking. They are really bad and last throughout the day.  Last LDL was drawn on 03/2023, at goal (<100). Follow-up with cardiologist Dr. Debera is not scheduled, therefore will contact cardiologist to discuss next steps. She could try a different statin, try taking Crestor  every other day or every third day, or try a lower dose and gradually increase dose over the course of months.  Will let cardiologist determine next plan and reach out to him to discuss this with patient. All questions by the patient were answered.  Angela Baalmann, PharmD [facility] Pharmacist

## 2024-03-22 NOTE — Telephone Encounter (Signed)
 Patient informed and verbalized understanding of plan.

## 2024-03-28 ENCOUNTER — Ambulatory Visit

## 2024-04-26 ENCOUNTER — Ambulatory Visit: Payer: Self-pay

## 2024-04-28 ENCOUNTER — Ambulatory Visit

## 2024-05-26 ENCOUNTER — Ambulatory Visit: Admitting: Cardiology

## 2024-05-30 ENCOUNTER — Ambulatory Visit: Admitting: Physician Assistant

## 2024-08-04 ENCOUNTER — Ambulatory Visit
